# Patient Record
Sex: Female | Born: 1975 | Race: White | Hispanic: No | Marital: Married | Smoking: Never smoker
Health system: Southern US, Community
[De-identification: ages and names within clinical notes are randomized; demographics above are authoritative.]

## PROBLEM LIST (undated history)

## (undated) DIAGNOSIS — E785 Hyperlipidemia, unspecified: Secondary | ICD-10-CM

## (undated) DIAGNOSIS — Z0000 Encounter for general adult medical examination without abnormal findings: Secondary | ICD-10-CM

## (undated) DIAGNOSIS — Z9889 Other specified postprocedural states: Secondary | ICD-10-CM

## (undated) HISTORY — DX: Other specified postprocedural states: Z98.890

## (undated) HISTORY — DX: Encounter for general adult medical examination without abnormal findings: Z00.00

## (undated) HISTORY — DX: Hyperlipidemia, unspecified: E78.5

## (undated) HISTORY — PX: DILATION AND CURETTAGE OF UTERUS: SHX78

## (undated) HISTORY — PX: SINUS SURGERY WITH INSTATRAK: SHX5215

---

## 2001-01-28 ENCOUNTER — Other Ambulatory Visit: Admission: RE | Admit: 2001-01-28 | Discharge: 2001-01-28 | Payer: Self-pay | Admitting: Obstetrics and Gynecology

## 2001-07-29 ENCOUNTER — Encounter: Payer: Self-pay | Admitting: Otolaryngology

## 2001-07-29 ENCOUNTER — Ambulatory Visit (HOSPITAL_COMMUNITY): Admission: RE | Admit: 2001-07-29 | Discharge: 2001-07-29 | Payer: Self-pay | Admitting: Otolaryngology

## 2002-02-17 ENCOUNTER — Other Ambulatory Visit: Admission: RE | Admit: 2002-02-17 | Discharge: 2002-02-17 | Payer: Self-pay | Admitting: Obstetrics and Gynecology

## 2002-04-19 ENCOUNTER — Encounter: Admission: RE | Admit: 2002-04-19 | Discharge: 2002-05-26 | Payer: Self-pay | Admitting: Occupational Medicine

## 2002-04-27 ENCOUNTER — Encounter: Payer: Self-pay | Admitting: Occupational Medicine

## 2002-04-27 ENCOUNTER — Encounter: Admission: RE | Admit: 2002-04-27 | Discharge: 2002-04-27 | Payer: Self-pay | Admitting: Occupational Medicine

## 2002-11-22 ENCOUNTER — Encounter: Admission: RE | Admit: 2002-11-22 | Discharge: 2003-02-20 | Payer: Self-pay | Admitting: Nurse Practitioner

## 2003-02-21 ENCOUNTER — Encounter: Admission: RE | Admit: 2003-02-21 | Discharge: 2003-02-21 | Payer: Self-pay | Admitting: Nurse Practitioner

## 2003-03-17 ENCOUNTER — Other Ambulatory Visit: Admission: RE | Admit: 2003-03-17 | Discharge: 2003-03-17 | Payer: Self-pay | Admitting: Obstetrics and Gynecology

## 2003-07-24 ENCOUNTER — Encounter (INDEPENDENT_AMBULATORY_CARE_PROVIDER_SITE_OTHER): Payer: Self-pay | Admitting: Specialist

## 2003-07-24 ENCOUNTER — Ambulatory Visit (HOSPITAL_COMMUNITY): Admission: RE | Admit: 2003-07-24 | Discharge: 2003-07-24 | Payer: Self-pay | Admitting: Obstetrics and Gynecology

## 2003-08-09 ENCOUNTER — Inpatient Hospital Stay (HOSPITAL_COMMUNITY): Admission: EM | Admit: 2003-08-09 | Discharge: 2003-08-16 | Payer: Self-pay | Admitting: Emergency Medicine

## 2003-08-10 ENCOUNTER — Encounter (INDEPENDENT_AMBULATORY_CARE_PROVIDER_SITE_OTHER): Payer: Self-pay | Admitting: *Deleted

## 2003-11-28 ENCOUNTER — Ambulatory Visit (HOSPITAL_COMMUNITY): Admission: RE | Admit: 2003-11-28 | Discharge: 2003-11-28 | Payer: Self-pay | Admitting: Internal Medicine

## 2004-01-26 ENCOUNTER — Ambulatory Visit: Payer: Self-pay | Admitting: Oncology

## 2004-03-14 ENCOUNTER — Ambulatory Visit: Payer: Self-pay | Admitting: Oncology

## 2004-05-02 ENCOUNTER — Other Ambulatory Visit: Admission: RE | Admit: 2004-05-02 | Discharge: 2004-05-02 | Payer: Self-pay | Admitting: Obstetrics and Gynecology

## 2004-05-16 ENCOUNTER — Ambulatory Visit: Payer: Self-pay | Admitting: Oncology

## 2004-07-10 ENCOUNTER — Ambulatory Visit: Payer: Self-pay | Admitting: Oncology

## 2004-08-14 ENCOUNTER — Ambulatory Visit (HOSPITAL_COMMUNITY): Admission: RE | Admit: 2004-08-14 | Discharge: 2004-08-14 | Payer: Self-pay | Admitting: Obstetrics and Gynecology

## 2004-08-18 ENCOUNTER — Inpatient Hospital Stay (HOSPITAL_COMMUNITY): Admission: AD | Admit: 2004-08-18 | Discharge: 2004-08-18 | Payer: Self-pay | Admitting: Obstetrics and Gynecology

## 2004-09-05 ENCOUNTER — Ambulatory Visit: Payer: Self-pay | Admitting: Oncology

## 2004-09-24 ENCOUNTER — Inpatient Hospital Stay (HOSPITAL_COMMUNITY): Admission: AD | Admit: 2004-09-24 | Discharge: 2004-09-24 | Payer: Self-pay | Admitting: Obstetrics and Gynecology

## 2004-10-22 ENCOUNTER — Inpatient Hospital Stay (HOSPITAL_COMMUNITY): Admission: AD | Admit: 2004-10-22 | Discharge: 2004-10-22 | Payer: Self-pay | Admitting: Obstetrics and Gynecology

## 2004-10-25 ENCOUNTER — Ambulatory Visit: Payer: Self-pay | Admitting: Oncology

## 2004-10-29 ENCOUNTER — Inpatient Hospital Stay (HOSPITAL_COMMUNITY): Admission: AD | Admit: 2004-10-29 | Discharge: 2004-11-01 | Payer: Self-pay | Admitting: Obstetrics & Gynecology

## 2004-11-08 ENCOUNTER — Inpatient Hospital Stay (HOSPITAL_COMMUNITY): Admission: AD | Admit: 2004-11-08 | Discharge: 2004-11-12 | Payer: Self-pay | Admitting: Obstetrics and Gynecology

## 2004-11-11 ENCOUNTER — Ambulatory Visit: Payer: Self-pay | Admitting: Oncology

## 2004-11-11 ENCOUNTER — Encounter (INDEPENDENT_AMBULATORY_CARE_PROVIDER_SITE_OTHER): Payer: Self-pay | Admitting: *Deleted

## 2004-12-03 ENCOUNTER — Other Ambulatory Visit: Admission: RE | Admit: 2004-12-03 | Discharge: 2004-12-03 | Payer: Self-pay | Admitting: Obstetrics and Gynecology

## 2004-12-16 ENCOUNTER — Ambulatory Visit: Payer: Self-pay | Admitting: Oncology

## 2005-02-10 ENCOUNTER — Ambulatory Visit: Payer: Self-pay | Admitting: Oncology

## 2006-02-12 ENCOUNTER — Ambulatory Visit: Payer: Self-pay | Admitting: Oncology

## 2006-11-01 IMAGING — US US ABDOMEN COMPLETE
1 series · 13 of 25 positions shown · non-contrast
Comparison: none

CLINICAL DATA: 25 weeks pregnant.  Right upper quadrant abdominal pain.   
ABDOMEN ULTRASOUND:
TECHNIQUE: Complete abdominal ultrasound examination was performed including evaluation of the liver, gallbladder, bile ducts, pancreas, kidneys, spleen, IVC, and abdominal aorta.
There is no evidence of gallstones or gallbladder wall thickening.   There is no evidence of biliary ductal dilatation with common bile duct measuring approximately 3 mm.   The liver is normal in echogenicity and no focal liver lesions are seen.   Visualized portion of the IVC and pancreas are unremarkable.  
There is no evidence of splenomegaly.   Both kidneys are normal in size and no renal masses or other parenchymal lesions are identified.   There is no evidence of right-sided pelvicaliectasis.  Mild left renal pelvicaliectasis is noted, which is greater than typically seen at this stage of pregnancy.   The visualized portion of the abdominal aorta is nondilated and there is no evidence of abnormal fluid collections.

[Series 1: us abdomen complete · 0.35mm/px · 13 of 99 slices shown]
[im 1/99]
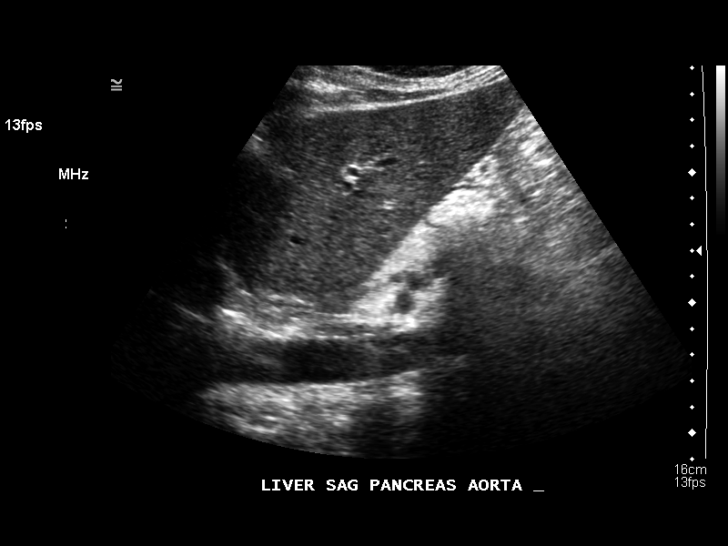
[im 9/99]
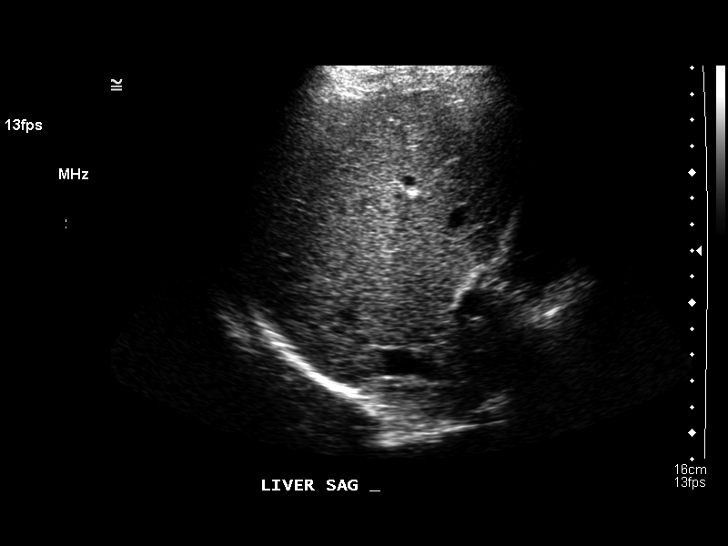
[im 17/99]
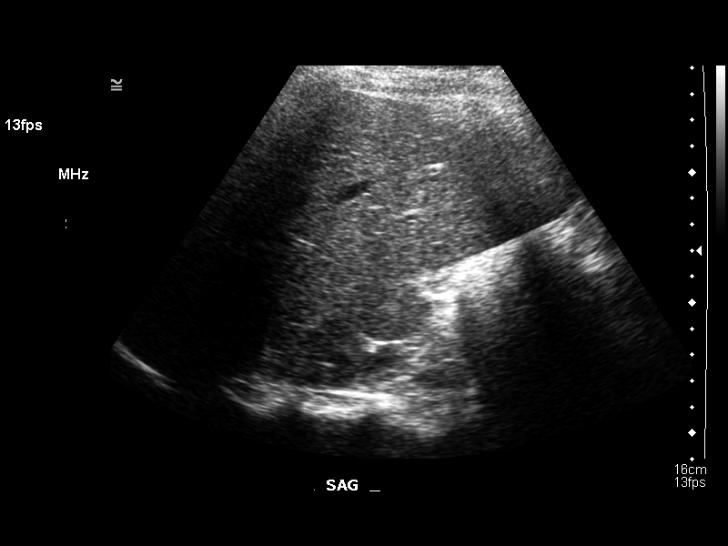
[im 25/99]
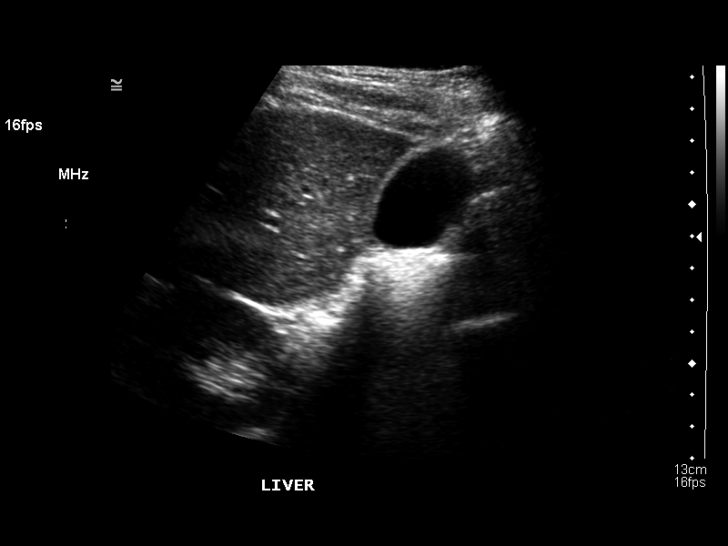
[im 33/99]
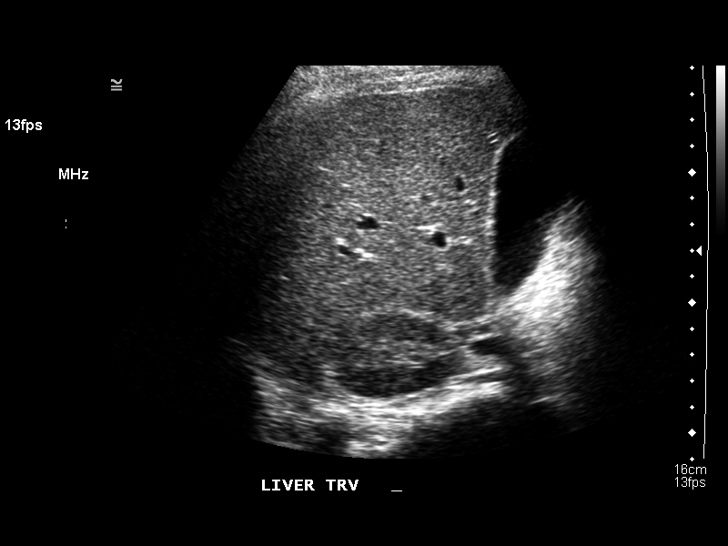
[im 41/99]
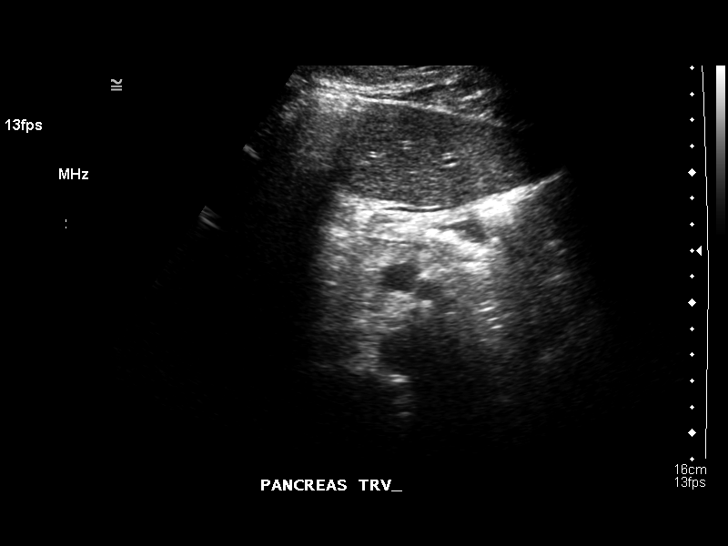
[im 50/99]
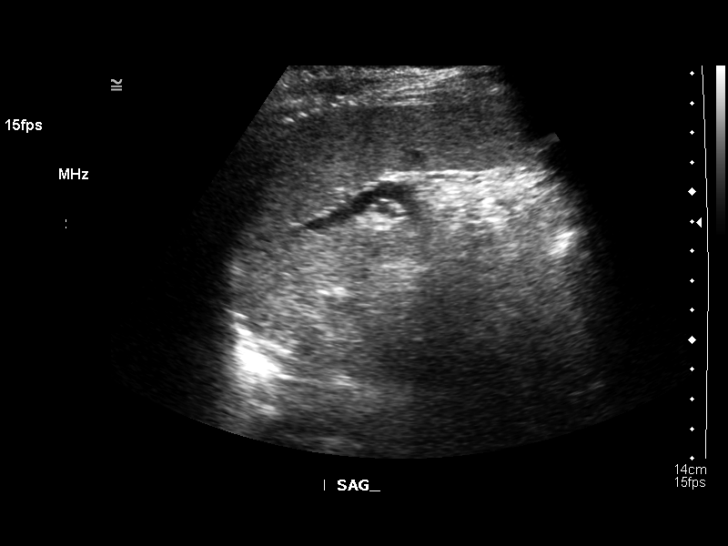
[im 58/99]
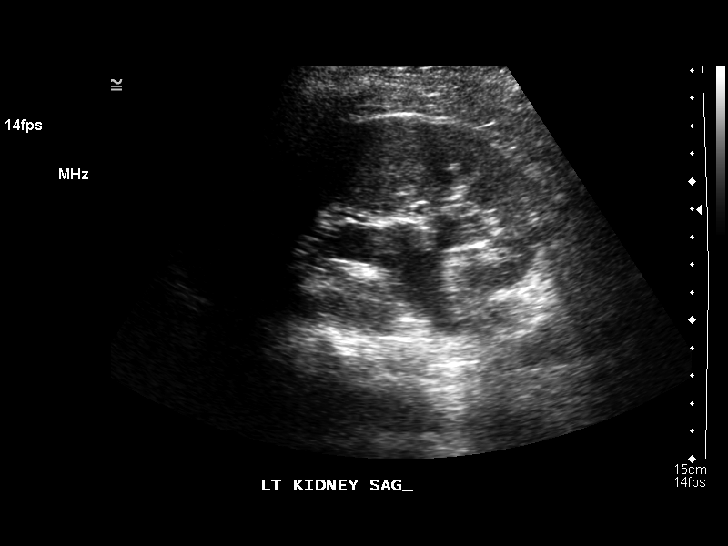
[im 66/99]
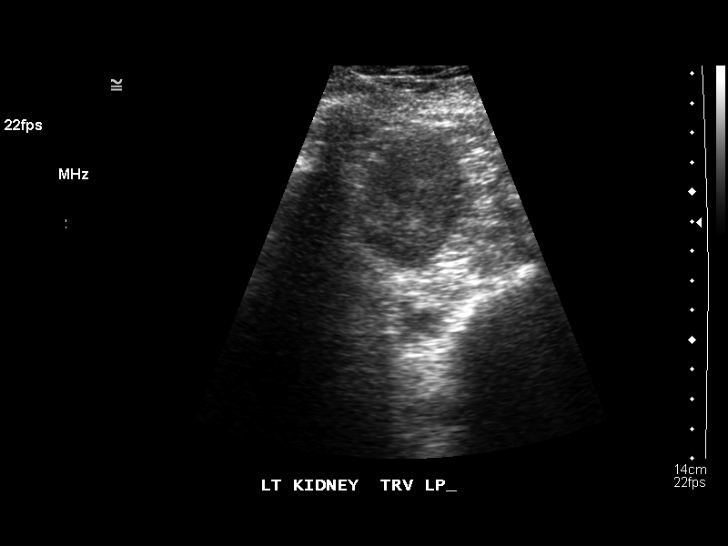
[im 74/99]
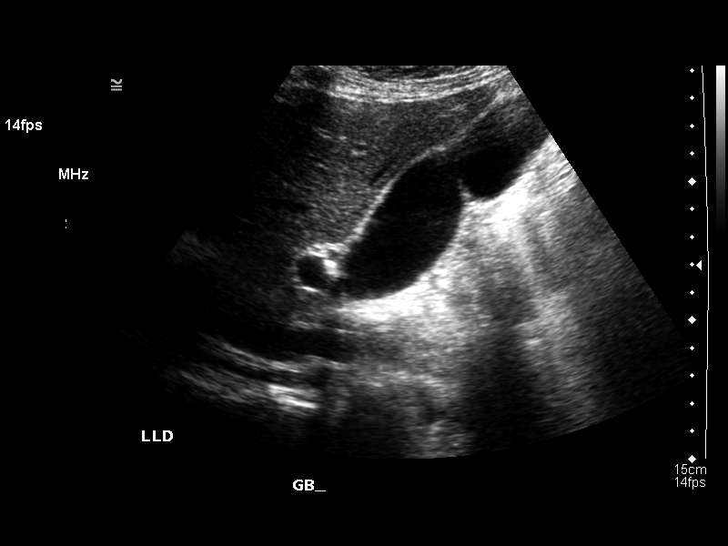
[im 82/99]
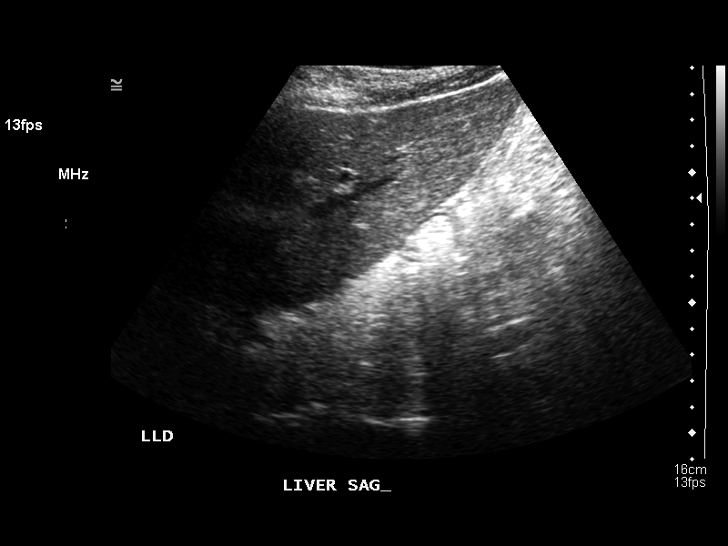
[im 90/99]
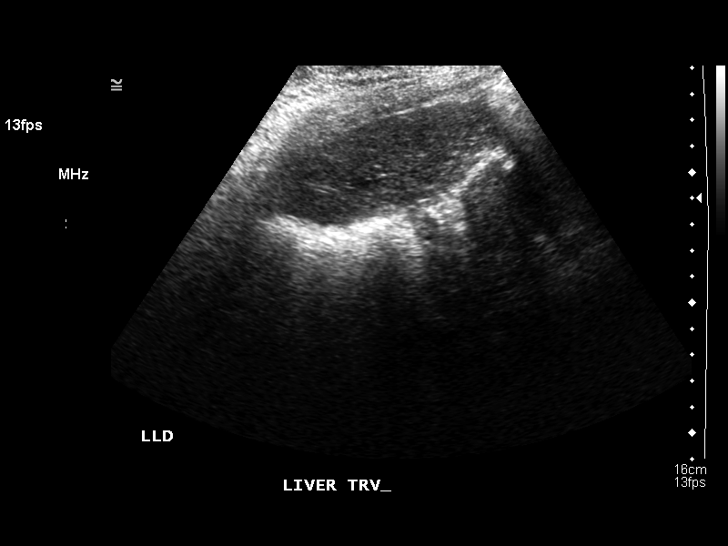
[im 99/99]
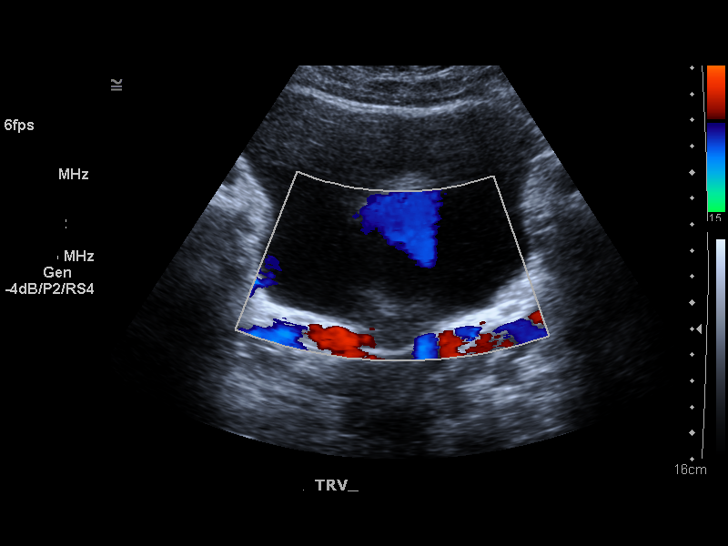

[13 of 25 positions shown; findings below may reference images not displayed]

IMPRESSION: 1.  No evidence of gallstones or biliary dilatation.
2.  Mild left renal pelvicaliectasis, greater then typically seen at this stage in pregnancy.  This is of uncertain etiology and significance.

## 2007-04-16 ENCOUNTER — Encounter (INDEPENDENT_AMBULATORY_CARE_PROVIDER_SITE_OTHER): Payer: Self-pay | Admitting: Obstetrics and Gynecology

## 2007-04-16 ENCOUNTER — Ambulatory Visit (HOSPITAL_COMMUNITY): Admission: RE | Admit: 2007-04-16 | Discharge: 2007-04-16 | Payer: Self-pay | Admitting: Obstetrics and Gynecology

## 2008-03-08 ENCOUNTER — Inpatient Hospital Stay (HOSPITAL_COMMUNITY): Admission: RE | Admit: 2008-03-08 | Discharge: 2008-03-10 | Payer: Self-pay | Admitting: Obstetrics and Gynecology

## 2008-03-09 ENCOUNTER — Ambulatory Visit: Payer: Self-pay | Admitting: Oncology

## 2008-09-03 ENCOUNTER — Emergency Department (HOSPITAL_COMMUNITY): Admission: EM | Admit: 2008-09-03 | Discharge: 2008-09-03 | Payer: Self-pay | Admitting: Family Medicine

## 2008-09-03 ENCOUNTER — Emergency Department (HOSPITAL_COMMUNITY): Admission: EM | Admit: 2008-09-03 | Discharge: 2008-09-04 | Payer: Self-pay | Admitting: Emergency Medicine

## 2008-09-04 ENCOUNTER — Encounter: Payer: Self-pay | Admitting: Internal Medicine

## 2008-09-06 ENCOUNTER — Encounter (INDEPENDENT_AMBULATORY_CARE_PROVIDER_SITE_OTHER): Payer: Self-pay | Admitting: Internal Medicine

## 2008-09-06 ENCOUNTER — Ambulatory Visit: Payer: Self-pay

## 2008-09-08 ENCOUNTER — Encounter: Payer: Self-pay | Admitting: Internal Medicine

## 2008-09-14 ENCOUNTER — Ambulatory Visit: Payer: Self-pay | Admitting: Internal Medicine

## 2008-09-14 DIAGNOSIS — R0602 Shortness of breath: Secondary | ICD-10-CM

## 2008-09-14 DIAGNOSIS — R079 Chest pain, unspecified: Secondary | ICD-10-CM

## 2008-09-14 DIAGNOSIS — I498 Other specified cardiac arrhythmias: Secondary | ICD-10-CM | POA: Insufficient documentation

## 2008-09-19 ENCOUNTER — Encounter: Payer: Self-pay | Admitting: Internal Medicine

## 2008-10-03 ENCOUNTER — Telehealth (INDEPENDENT_AMBULATORY_CARE_PROVIDER_SITE_OTHER): Payer: Self-pay | Admitting: *Deleted

## 2008-10-12 ENCOUNTER — Encounter: Payer: Self-pay | Admitting: Internal Medicine

## 2008-10-12 ENCOUNTER — Ambulatory Visit: Payer: Self-pay

## 2008-11-22 ENCOUNTER — Ambulatory Visit (HOSPITAL_COMMUNITY): Admission: RE | Admit: 2008-11-22 | Discharge: 2008-11-22 | Payer: Self-pay | Admitting: Internal Medicine

## 2009-04-06 DIAGNOSIS — E785 Hyperlipidemia, unspecified: Secondary | ICD-10-CM | POA: Insufficient documentation

## 2009-04-06 DIAGNOSIS — D239 Other benign neoplasm of skin, unspecified: Secondary | ICD-10-CM | POA: Insufficient documentation

## 2009-04-06 DIAGNOSIS — Z86711 Personal history of pulmonary embolism: Secondary | ICD-10-CM | POA: Insufficient documentation

## 2009-04-06 DIAGNOSIS — M545 Low back pain, unspecified: Secondary | ICD-10-CM | POA: Insufficient documentation

## 2009-07-02 DIAGNOSIS — E559 Vitamin D deficiency, unspecified: Secondary | ICD-10-CM | POA: Insufficient documentation

## 2009-07-02 DIAGNOSIS — E039 Hypothyroidism, unspecified: Secondary | ICD-10-CM | POA: Insufficient documentation

## 2010-04-20 LAB — D-DIMER, QUANTITATIVE: D-Dimer, Quant: 0.47 ug/mL-FEU (ref 0.00–0.48)

## 2010-04-20 LAB — POCT I-STAT, CHEM 8
BUN: 14 mg/dL (ref 6–23)
Calcium, Ion: 1.14 mmol/L (ref 1.12–1.32)
Chloride: 107 mEq/L (ref 96–112)
Creatinine, Ser: 0.8 mg/dL (ref 0.4–1.2)
Glucose, Bld: 92 mg/dL (ref 70–99)
HCT: 38 % (ref 36.0–46.0)
Hemoglobin: 12.9 g/dL (ref 12.0–15.0)
Potassium: 3.7 mEq/L (ref 3.5–5.1)
Sodium: 140 mEq/L (ref 135–145)
TCO2: 24 mmol/L (ref 0–100)

## 2010-04-30 LAB — CBC
HCT: 29.2 % — ABNORMAL LOW (ref 36.0–46.0)
HCT: 30 % — ABNORMAL LOW (ref 36.0–46.0)
Hemoglobin: 10.1 g/dL — ABNORMAL LOW (ref 12.0–15.0)
Hemoglobin: 9.7 g/dL — ABNORMAL LOW (ref 12.0–15.0)
MCHC: 33.2 g/dL (ref 30.0–36.0)
MCHC: 33.8 g/dL (ref 30.0–36.0)
MCV: 90.8 fL (ref 78.0–100.0)
MCV: 90.9 fL (ref 78.0–100.0)
Platelets: 251 10*3/uL (ref 150–400)
Platelets: 272 10*3/uL (ref 150–400)
RBC: 3.22 MIL/uL — ABNORMAL LOW (ref 3.87–5.11)
RBC: 3.3 MIL/uL — ABNORMAL LOW (ref 3.87–5.11)
RDW: 13.8 % (ref 11.5–15.5)
RDW: 13.8 % (ref 11.5–15.5)
WBC: 13.2 10*3/uL — ABNORMAL HIGH (ref 4.0–10.5)
WBC: 8.7 10*3/uL (ref 4.0–10.5)

## 2010-04-30 LAB — RPR: RPR Ser Ql: NONREACTIVE

## 2010-05-20 ENCOUNTER — Other Ambulatory Visit: Payer: Self-pay | Admitting: Dermatology

## 2010-05-28 NOTE — Op Note (Signed)
[name], [name]                 ACCOUNT NO.:  [id number]   MEDICAL RECORD NO.:  [id number]          PATIENT TYPE:  AMB   LOCATION:  DAY                          FACILITY:  [facility]   PHYSICIAN:  Michelle L. Grewal, M.D.DATE OF BIRTH:  Jun 01, 1975   DATE OF PROCEDURE:  04/16/2007  DATE OF DISCHARGE:                               OPERATIVE REPORT   PREOPERATIVE DIAGNOSES:  1. Missed abortion.  2. History of pulmonary vein thrombosis.   POSTOPERATIVE DIAGNOSES:  1. Missed abortion.  2. History of pulmonary vein thrombosis.   PROCEDURE:  Dilatation and evacuation with chromosome analysis.   SURGEON:  Dr. Vincente Poli.   ANESTHESIA:  MAC with local.   FINDINGS:  Products of conception.   PROCEDURE:  The patient was taken to the operating room.  She was  prepped and draped in the usual fashion, in-and-out catheter was used to  empty the bladder.  Antibiotics of course were given and the patient had  pulsatile stockings on prior to induction of anesthesia. After the  patient prepped and draped, a speculum was placed in the vagina and the  cervix was grasped with a tenaculum.  A paracervical block was performed  in standard fashion.  It was noted that the patient had a moderate of  blood in the vagina which she had reported she had started with  overnight.  The cervix was slightly dilated.  I did dilate a little bit  more with Pratt dilators and inserted a #7 suction cannula inside the  uterus and suctioned the tissue out consistent with products of  conception.  I did this twice. A sharp curette was inserted, minimal  tissue was noted at the time, the uterine cavity was very clean.  A  final suction curettage was performed which retrieved no tissue.  All  instruments were removed from the vagina. There was no vaginal bleeding  noted.  All sponge, lap and instrument counts were correct x2.  The  patient went to recovery room in stable condition.      Michelle L. Vincente Poli, M.D.  Electronically Signed     MLG/MEDQ  D:  04/16/2007  T:  04/16/2007  Job:  [id number]

## 2010-05-31 NOTE — Discharge Summary (Signed)
[name], [name]                             ACCOUNT NO.:  [id number]   MEDICAL RECORD NO.:  [id number]                   PATIENT TYPE:  INP   LOCATION:  2022                                 FACILITY:  [facility]   PHYSICIAN:  Melissa L. Ladona Ridgel, MD               DATE OF BIRTH:  [birth year]   DATE OF ADMISSION:  08/09/2003  DATE OF DISCHARGE:  08/16/2003                                 DISCHARGE SUMMARY   ADMISSION DIAGNOSES:  1. Chest pain.  2. Shortness of breath.  3. Sinus polyps.   DISCHARGE DIAGNOSES:  1. Pulmonary embolus with pulmonary vein thrombosis.  2. Stable seasonal allergies.  3. Right arm hematoma secondary to blood draws while patient on Coumadin.   HISTORY OF PRESENT ILLNESS:  The patient is a 35 year old female who on the  day of admission experienced a sharp chest pain with some associated  shortness of breath and diaphoresis.  She presented to the emergency room  for evaluation and was found to have two left upper lobe pulmonary emboli as  well as bilateral pulmonary venous thrombosis on chest CT.  The patient was  started on heparin after a repeat platelet count as her initial admitting  platelet count was 70,000.  Repeat platelet count in a blue top tube  revealed a true platelet count of 254,000.  Her D-dimer was interestingly  enough negative on admission and she was admitted for further workup of a  hypercoagulable state.  The patient's hypercoagulability panel revealed  negativity for factor V mutation.  Her antithrombin III was within normal  limits range at 102.  Her total protein C was in normal range at 111.  Her  functional protein C was in reference range at 131.  Her total protein S was  in range at 124.  Her functional protein S was 99 which is well within  range.  Her PTTLA was 37.8 which is also within normal limits.  Her DRVTT  was 39.9 within normal limits.  A lupus anticoagulant was not detected.  Homocystine level was within normal range at  8.31.  Her beta-II glycoprotein  1AB/IgG was 5 and her beta-II glycoprotein 1AB/IgM was less than 1.  Her  anticardiolipin antibody IgG was 9.  Her anticardiolipin antibody IgM was 6.  Her anticardiolipin IgA was 1 all within reasonable range.  She is negative  for prothrombin II gene mutation.  During the course of her hospitalization,  the patient was started on a heparin drip and Coumadin was introduced on day  #2.  At the time of discharge, her pertinent laboratory studies reveal a PT  of 22.8 and an INR of 2.7 on 7.5 mg of Coumadin.  Her CBC on the day of  discharge reveals a white count of 7.7, hemoglobin 13.1, hematocrit 37.9 and  platelets 215.  During the course of the hospitalization, the patient  experienced the formation of  a right forearm hematoma related to a blood  draw.  The area today is ecchymotic and tender.  There is a central area of  tense swelling which actually appears slightly improved from yesterday.  She  had improved mobility today, but still has a way to go.  She has been  instructed to elevate that arm and continue to facilitate as much medicine  as she possibly can as she has been provided with a sling and understands  that this discoloration will likely increase while it is getting better.   DISCHARGE PHYSICAL EXAMINATION:  VITAL SIGNS:  Temperature 97.1, blood  pressure systolic 110/60, pulse 68, respirations 20, 98% on room air.  She  is able to ambulate without discomfort and saturates appropriately.  GENERAL:  She is a pleasant, white female in minimal distress secondary to  the pain in her right arm.  HEENT:  Normocephalic, atraumatic.  Pupils equal round and reactive to  light.  Extraocular movements intact.  Mucous membranes moist.  CHEST:  Clear to auscultation.  No rhonchi, rales or wheezes.  CARDIAC:  Regular rate and rhythm with positive S1, S2, no S3, S4.  No  murmurs, rubs or gallops.  ABDOMEN:  Soft, nontender, nondistended with positive bowel  sounds.  EXTREMITIES:  Right forearm over the antecubital area and belly of the bicep  appears ecchymotic and tender.  There is firm swelling which is slightly  improved from yesterday.  The ecchymosis extends up into the triceps  compartment and axilla.  Otherwise, she has no clubbing, cyanosis or edema.   SPECIAL INSTRUCTIONS:  The patient has been instructed extensively on the  need to plan her next pregnancy since she will be on Coumadin and this can  be harmful to her fetus.  She has been in close contact with her OB/GYN  physician and they have arranged alternative form of contraception.  She has  viewed the Coumadin video and literature and has been directed to that  information for her dietary restriction.   FOLLOW UP:  She has been instructed to follow up with Dr. Marcelino Duster L.  Grewal, her OB/GYN, as arranged previously and to make an appointment to see  Dr. Maurice Small on Thursday or Friday of this week to have her INR  checked and have her FMLA papers filled out.   DISPOSITION:  The patient is stable for discharge to home with followup with  her primary care physician and her OB/GYN.   DISCHARGE MEDICATIONS:  1. Singulair 10 mg q.d.  2. Nasonex used in the hospital, but she is to resume her Rhinocort two     sprays q.d.  3. Coumadin 5 mg once daily.  4. Prenatal vitamin once daily.  5. Colace 100 mg twice daily as needed.  6. Claritin D 10 mg once daily.                                                Melissa L. Ladona Ridgel, MD    MLT/MEDQ  D:  08/16/2003  T:  08/16/2003  Job:  [id number]   cc:   Gretta Arab. Valentina Lucks, M.D.  [address]  [place]  [place] [address]  Fax: [phone]   Stann Mainland. Vincente Poli, M.D.  [address], [address] [place]  [place] [address]  Fax: [phone]

## 2010-05-31 NOTE — Discharge Summary (Signed)
[name], [name]                             ACCOUNT NO.:  [id number]   MEDICAL RECORD NO.:  [id number]                   PATIENT TYPE:  INP   LOCATION:  2022                                 FACILITY:  [facility]   PHYSICIAN:  Melissa L. Ladona Ridgel, MD               DATE OF BIRTH:  [birth year]   DATE OF ADMISSION:  08/09/2003  DATE OF DISCHARGE:  08/16/2003                                 DISCHARGE SUMMARY   ADDENDUM:  Please note Vicodin 5/500 #15 total were provided for pain relief  of the right arm.  Also, the recommendation for followup of her potential  hypercoagulability would be that after approximately six months of  anticoagulation that she undergo repeat testing for hypercoagulability.  She  was instructed on things to do in situations, for instance; long car rides,  airplane rides and was instructed to check in with her primary care  physician before performing these activities.  A hematology oncology consult  may be warranted at the time of this discontinuation of her anticoagulation  and this can be provided through her primary care physician.                                                Melissa L. Ladona Ridgel, MD    MLT/MEDQ  D:  08/16/2003  T:  08/16/2003  Job:  [id number]   cc:   Marcelino Duster L. Vincente Poli, M.D.  [address], [address]  [place]  [place] [address]  Fax: [phone]   Gretta Arab. Valentina Lucks, M.D.  [address] [place]  [place] [address]  Fax: [phone]

## 2010-05-31 NOTE — H&P (Signed)
NAMEHERBERT, [name]                             ACCOUNT NO.:  [id number]   MEDICAL RECORD NO.:  [id number]                   PATIENT TYPE:  INP   LOCATION:  1824                                 FACILITY:  [facility]   PHYSICIAN:  Melissa L. Ladona Ridgel, MD               DATE OF BIRTH:  Oct 05, 1975   DATE OF ADMISSION:  08/09/2003  DATE OF DISCHARGE:                                HISTORY & PHYSICAL   CHIEF COMPLAINT:  Chest pain.   PRIMARY CARE PHYSICIAN:  Gretta Arab. Valentina Lucks, M.D.   HISTORY OF PRESENT ILLNESS:  The patient is a 35 year old white female who  is status post a dilation and evacuation of a nonviable pregnancy on July  11. The patient found at her 10-week visit that the fetus was nonviable  without a heart beat. She underwent the procedure and was home the next day  without complications. She has had no bleeding since last Saturday. Today  while getting ready to go to work, she developed some chest discomfort and  thought nothing of it thinking perhaps it was gas. 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 She also reports on the night prior to this admission she had  an episode of nausea with no vomiting but an episode of diarrhea lasting  approximately an hour. This episode was unclear in its etiology and has not  continued today.  When the symptom that occurred this morning worsened, she  came to the emergency room and was found to have pulmonary emboli in the  left upper lobe as well as pulmonary venous thrombosis on chest CT.  Initially the patient's platelets were found to be 70 in the emergency room.  The platelet count was repeated in a blue top tube and discovered that the  platelet count is really 254. We therefore starter her on heparin. Her D-  dimer was negative, interestingly enough.  She gives no family history of  any  hypercoagulable state.   REVIEW OF SYMPTOMS:  As above. She denies any dysuria, hematuria, melena,  hematochezia and all other review of systems are negative.Marland Kitchen   PAST MEDICAL HISTORY:  Sinus polyp, back pain related to occupational injury  and increased cholesterol for which she is on dietary control.   PAST SURGICAL HISTORY:  Polyps in her sinuses x3.   ALLERGIES:  PENICILLIN.   SOCIAL HISTORY:  She does not smoke, she does drink, she does not illicit  drugs or any over the counter herbal remedies.   MEDICATIONS:  1. Prenatal vitamin q.day.  2. Claritin D p.r.n.  3. Singulair 10 mg q.day.  4. Rhinocort 2 sprays once daily.  5. Skelaxin p.r.n. for her back.  6. Motrin p.r.n.   FAMILY HISTORY:  Significant for her dad living with  multiple sclerosis, her  mom living with increased cholesterol and osteopenia. A grandmother had  breast cancer and her sister is alive and healthy.   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 The patient states that she  stopped oral contraceptives back in October after being on them for 10  years.   LABORATORY DATA:  Her laboratory values show two small left upper lobe  pulmonary emboli, ill defined left lower lobe, pulmonary emboli, bilateral  superior pulmonary vein thromboses, x-ray shows bronchitic changes.  Her  sodium is 137, potassium 4.7, chloride 107. CO2 is 28.7, BUN is 9,  creatinine is 0.7.  White count is 8.6, hemoglobin 13.7, hematocrit 39.7  with a platelet count of 254.  This is off of a EDTA tube.  Her LFT's are  within normal limits. Her total bilirubin is mildly elevated at 1.3 with an  indirect bilirubin of 1.1.  Point of care enzymes are negative x2 sets, her  D-dimer is 0.27 which is negative.  Her pH is 7.4, pCO2 of 40.3 and her  bicarb is 27.3 on venous gas.   EKG shows normal sinus rhythm with sinus arrhythmia.   PHYSICAL EXAMINATION:  VITAL SIGNS:  Temperature is 98, blood pressure  111/68, pulse 65, respiratory  rate 22.  Pulse ox is 99% on 2 liters.  GENERAL:  Well-developed, well-nourished, white female in no acute distress.  HEENT:  Pupils equal round and reactive to light. Extraocular movements  intact. She is anicteric.  Mucous membranes are moist.  NECK:  Supple. There is no JVD, no lymph nodes.  CHEST:  Clear to auscultation.  No rhonchi, rales or wheezes.  CARDIOVASCULAR:  Regular rate and rhythm, positive S1, S2, no S3, S4. No  murmurs, rubs or gallops.  ABDOMEN:  Soft, nontender, nondistended with positive bowel sounds.  EXTREMITIES:  2+ pulses. No cyanosis, clubbing or edema.  NEUROLOGIC:  She is nonfocal.  BREASTS:  Exam in the supine position only was performed with no obvious  lymphadenopathy and no breast masses noted.   ASSESSMENT/PLAN:  This is a 35 year old white female with presentation with  chest pain discovered to have pulmonary emboli in multiple origins as well  as pulmonary vein thrombosis status post dilation and evacuation of a  nonviable pregnancy. She will be to telemetry for anticoagulation and  further observation.   PLAN:  1. Because she has had unprotected sexual intercourse post procedure and had     an episode of nausea with diarrhea with last p.m., we will check her     pregnancy test to assure that she is not pregnant.  2. Pulmonary emboli.  Will start her on heparin, Coumadin will be started in     the next 48 hours.  We will send off a hypercoagulability panel and     Doppler her lower extremities to rule out possible DVT.  If not genetic     disorder is present and no DVT are located in the lower extremities, we     might want to consider looking in the pelvic veins     for thrombosis.  Pulmonary wise she is on supportive O2.  Will order     incentive spirometry.  3. Cardiovascular.  She is sinus rhythm with no ST-T wave changes.  I will     check a 2-D echo to assess wall motions and for an ejection fraction.  Melissa L. Ladona Ridgel, MD    MLT/MEDQ  D:  08/09/2003  T:  08/09/2003  Job:  [id number]   cc:   Marcelino Duster L. Vincente Poli, M.D.  [address]  [place]  [place] [address]  Fax: [phone]   Gretta Arab. Valentina Lucks, M.D.  [address] [place]  [place] [address]  Fax: [phone]

## 2010-05-31 NOTE — Op Note (Signed)
NAMECARMEL, Danielle                 ACCOUNT NO.:  [id number]   MEDICAL RECORD NO.:  [id number]          PATIENT TYPE:  INP   LOCATION:  9317                          FACILITY:  [facility]   PHYSICIAN:  [name], M.D.DATE OF BIRTH:  January 15, 1975   DATE OF PROCEDURE:  11/11/2004  DATE OF DISCHARGE:                                 OPERATIVE REPORT   PREOPERATIVE DIAGNOSES:  1.  Postpartum hemorrhage.  2.  Retained tissue.   POSTOPERATIVE DIAGNOSES:  1.  Postpartum hemorrhage.  2.  Retained tissue.   PROCEDURE:  Dilatation and curettage.   SURGEON:  Danielle L. Vincente Poli, M.D.   ESTIMATED BLOOD LOSS:  100 mL.   ANESTHESIA:  MAC with local.   PATHOLOGY:  Remaining products of conception.   DRAINS:  None.   PROCEDURE:  The patient was taken to the operating room, where she was given  sedation and placed in lithotomy position.  She was prepped and draped in  the usual sterile fashion.  An in-and-out catheter used to empty the  bladder.  The speculum was inserted into the vagina.  The cervix was grasped  with a tenaculum.  Paracervical block was performed in the standard fashion.  The cervical internal os was noted to be slightly dilated.  A sharp curette  was inserted.  The uterus was thoroughly curetted of some what looked like  old fibrinous tissue, possibly a small, very scant amount of what looked to  be like chorioamnion.  There was no identifiable placental tissue noted.  The uterus was thoroughly curetted.  It seemed like her uterus had firmed down nicely.  I did give her Methergine  and Hemabate to help with uterine contraction.  I observed her after the  curettage was performed for bleeding for approximately 10 minutes in the  operating room, and no bleeding was noted.  The patient was then taken to  the recovery room in stable condition.  She will be observed overnight with  the Methergine series and Clindamycin for antibiotic prophylaxis.      Danielle L. Vincente Poli,  M.D.  Electronically Signed    MLG/MEDQ  D:  11/11/2004  T:  11/12/2004  Job:  [id number]

## 2010-05-31 NOTE — Discharge Summary (Signed)
[name], [name]                 ACCOUNT NO.:  [id number]   MEDICAL RECORD NO.:  [id number]          PATIENT TYPE:  INP   LOCATION:  9317                          FACILITY:  [facility]   PHYSICIAN:  Dineen Kid. Rana Snare, M.D.    DATE OF BIRTH:  October 22, 1975   DATE OF ADMISSION:  11/08/2004  DATE OF DISCHARGE:  11/12/2004                                 DISCHARGE SUMMARY   ADMISSION DIAGNOSES:  1.  Postpartum hemorrhage, status post spontaneous vaginal delivery      approximately eight days previously.  2.  History of pulmonary embolus, currently anticoagulated   DISCHARGE DIAGNOSES:  1.  Status post postpartum hemorrhage, stable.  2.  History of pulmonary and embolus, currently on prophylactic      anticoagulation.   PROCEDURE:  D&E.   REASON FOR ADMISSION:  Please see written H&P.   HOSPITAL COURSE:  The patient is a 35 year old gravida 2, para 1, that was  readmitted to [facility] eight days postpartum with  complaints of heavy vaginal bleeding and heavy clot formation.  The patient  was experiencing some mild cramping.  The patient did have a history of  previous pulmonary embolus currently on anticoagulation, Lovenox subcu and  Coumadin 5 mg alternating with 7.5 mg daily, under the care of hematologist.  On admission the patient was afebrile.  Heart rate was in the 70s.  Blood  pressure 105-110/50s-60s.  Speculum exam did reveal 150 mL of bright red  blood noted in the vagina.  The uterus after massage was firm and globular.  Ultrasound was ordered and CBC and type and cross and screen for two units  of blood was ordered.  Repeat speculum exam in approximately an hour did  reveal a small amount of dark red blood noted in vault.  Episiotomy was  without active bleeding.  The patient was started on antibiotics, serial  hemoglobin and hematocrit.  Lovenox dose was decreased to 40 mg, and the  patient's Coumadin was discontinued.  IV fluids were started with Pitocin,  and  Methergine was ordered intravascular for control of uterine atony.  Dr.  Myna Hidalgo, hematologist, was consulted regarding postpartum hemorrhage on  anticoagulant therapy.  Later that evening the patient had had a vaginal  pack which had fallen down during bowel movement.  Some increase in vaginal  bleeding was noted with a large-size clot that was passed.  A decision was  made to administer fresh frozen plasma and continue with the Methergine, IV  fluids, continue with 40 units of Pitocin.  Blood pressure was now 87/58.  The patient was then transferred to the AICU, where she could be monitored  closely.  Blood pressure 90/40s-60s, heart rate 60s to 70s.  Fundus was now  firm.  There was a scant amount of blood noted on the pad.  Hemoglobin was  8.1.  The following morning the patient did complain of some mild uterine  cramping.  She denied nausea, vomiting or increase in vaginal bleeding.  Vital signs were stable with blood pressure 87/40s-50s and heart rate was in  the 50s.  She was afebrile.  Fundus was firm with a scant amount of blood  noted on pad and no increase in vaginal bleeding or clots even with fundal  massage.  PT was 21.  PTT was 48.  INR was 1.8.  Fibrinogen was 352.  Hemoglobin was 7.4, platelet count of 400.  Two additional units of fresh  frozen plasma were given.  Later that morning the patient was without  complaint, o further bleeding, blood pressure 90/40s-50s, heart rate in the  60s.  Hemoglobin was now 6.9.  On the following morning the patient  continued to be stable, blood pressure 90s-100/50s-60s, pulse oximetry was  98% on room air.  Abdomen soft, fundus firm, slightly tender.  A scant  amount of blood was noted on the pad.  Hemoglobin was 8.4, platelet count of  351,000.  INR was 1.7, fibrinogen continued be stable at 435.  The patient  continued on DVT prophylaxis of Lovenox 40 mg daily.  The patient continued  to be monitored closely in the AICU with some slight  increase in activity  scheduled for later that morning.  Later that evening the patient did  increase some vaginal bleeding as associated with increase in activity,  soaking two pads with two baseball-size clots.  She denied feeling  lightheaded or dizzy or any complaints of shortness of breath.  Blood  pressure was 100-110/70s, heart rate at 70s to 80s.  Fundus was firm.  Hemoglobin was repeated, which was 8.4.,with INR of 1.4.  Methergine was  administered orally x3 doses.  The patient was again given two units of  fresh frozen plasma.  On the following morning the patient did have some  scant bleeding with some small clots.  Vital signs were stable.  She was  afebrile.  PT was 46.  INR was 1.1.  Hemoglobin 8.7, platelet count of 393.  A third dose of Methergine was due at noon on that day.  The patient was  typed and crossed for two units of packed red blood cells, Ultrasound was  scheduled.  Later that afternoon ultrasound had revealed suggestion of  retained placental products of conception.  Vital signs were stable.  She  was afebrile.  Risks were discussed with the patient and spouse regarding  possible surgical risks, all questions were answered.  Coagulation studies  were ordered preoperatively.  The patient was scheduled for D&E.  Later that  evening. the patient did sustain a D&E, which had revealed some retained  placental products.  The hematologist was in for a consultation, and  bleeding did subside.  On the following morning the patient was without  complaint, minimal bleeding was noted.  Vital signs were stable.  INR was  1.1.  PT was 14.1.  Platelet count 468,000.  Abdomen was soft, fundus firm.  IV antibiotics had been discontinued.  Discharge instructions reviewed and  the patient was later discharged home.   CONDITION ON DISCHARGE:  Stable.   DIET:  Regular as tolerated.   ACTIVITY:  Bedrest with bathroom privileges.  FOLLOW-UP:  Patient to follow up with Dr.  Cyndie Chime in approximately two  days.  The patient was also scheduled to return to the office in one to two  weeks for OB check.  She is to call for increase in vaginal bleeding,  uterine cramping, of feeling lightheaded or dizzy.   DISCHARGE MEDICATIONS:  1.  Lovenox 40 mg subcu q.a.m.  2.  Methergine 0.2 mg p.o. every eight hours x3 additional  doses.  3.  Keflex 500 mg one p.o. t.i.d. times 7 days.  4.  Prenatal vitamins one p.o. daily.      Julio Sicks, N.P.      Dineen Kid Rana Snare, M.D.  Electronically Signed    CC/MEDQ  D:  01/29/2005  T:  01/29/2005  Job:  [id number]

## 2010-05-31 NOTE — Op Note (Signed)
[name], [name]                             ACCOUNT NO.:  [id number]   MEDICAL RECORD NO.:  [id number]                   PATIENT TYPE:  AMB   LOCATION:  SDC                                  FACILITY:  [facility]   PHYSICIAN:  Michelle L. Vincente Poli, M.D.            DATE OF BIRTH:  [birth year]   DATE OF PROCEDURE:  07/24/2003  DATE OF DISCHARGE:  07/24/2003                                 OPERATIVE REPORT   PREOPERATIVE DIAGNOSES:  Missed abortion.   POSTOPERATIVE DIAGNOSES:  Missed abortion.   PROCEDURE:  Dilatation and evacuation.   SURGEON:  Michelle L. Vincente Poli, M.D.   ANESTHESIA:  MAC with paracervical.   ESTIMATED BLOOD LOSS:  Minimal.   DESCRIPTION OF PROCEDURE:  After informed consent was obtained, the patient  was taken to the operating room. She was given sedation and placed in the  high lithotomy position. She was prepped and draped in the standard fashion  and an in and out catheter was used to empty the bladder. A speculum was  inserted into the vagina, the cervix was grasped with a tenaculum and the  uterus was sounded to 7 cm and noted to be in the midline. The cervical  interval os was gently dilated using Pratt dilators. A #7 suction cannula  was inserted into the uterus and the uterus was thoroughly suctioned of all  contents grossly consistent with products of conception. The suction cannula  was removed and a sharp curette was inserted and a sharp curettage was then  thoroughly performed.  A final suction curettage was then performed. At the  end of the procedure, there was no vaginal bleeding noted, all instruments  were removed from the vagina, all sponge, lap and instrument counts were  correct x2. The patient tolerated the procedure well and went to the  recovery room in stable condition.                                               Michelle L. Vincente Poli, M.D.    Florestine Avers  D:  07/26/2003  T:  07/26/2003  Job:  [id number]

## 2010-07-26 ENCOUNTER — Encounter: Payer: Self-pay | Admitting: Internal Medicine

## 2010-10-08 LAB — CBC
MCHC: 35.3
MCV: 89.2
Platelets: 246
RBC: 4.15

## 2010-10-08 LAB — PROTIME-INR: Prothrombin Time: 13.1

## 2010-11-22 IMAGING — CT CT ANGIO CHEST
2 of 7 series · 19 of 36 positions shown · IV contrast (APPLIED)
Comparison: The prior examination 11/28/2003.

CLINICAL DATA: Chest pain shortness of breath.  Question pulmonary
embolism.

CT ANGIOGRAPHY CHEST WITH CONTRAST
TECHNIQUE: Multidetector CT imaging of the chest was performed
using the standard protocol during bolus administration of
intravenous contrast. Multiplanar CT image reconstructions
including MIPs were obtained to evaluate the vascular anatomy.
Contrast: 80 ml Emnipaque-LAA intravenously.

[Series 8: pulm embolism 1.0 b25f thins · axial · 0.67mm/px · z∈[-6,+249]mm · 18 of 285 slices shown]
[im 15/285  lung]
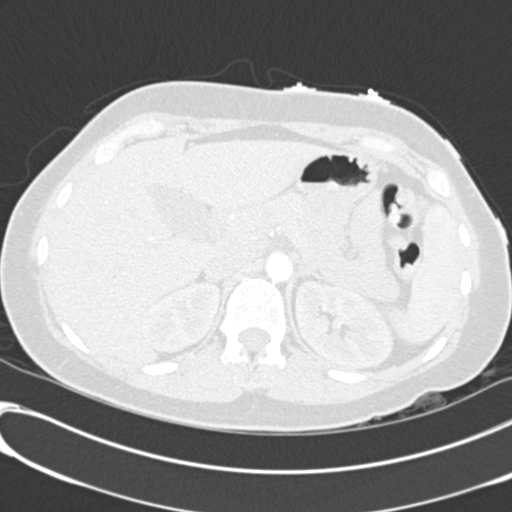
[im 29/285  mediastinal]
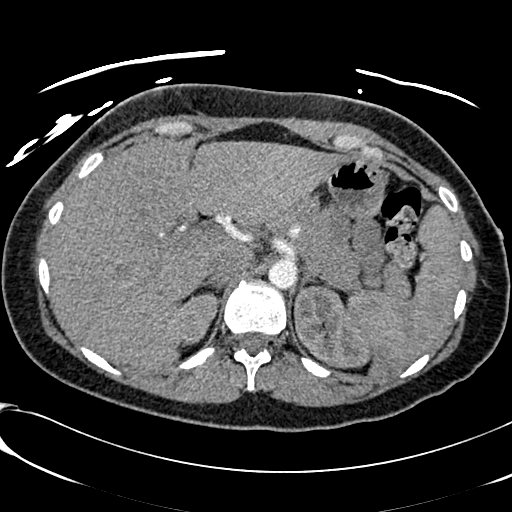
[im 43/285  lung]
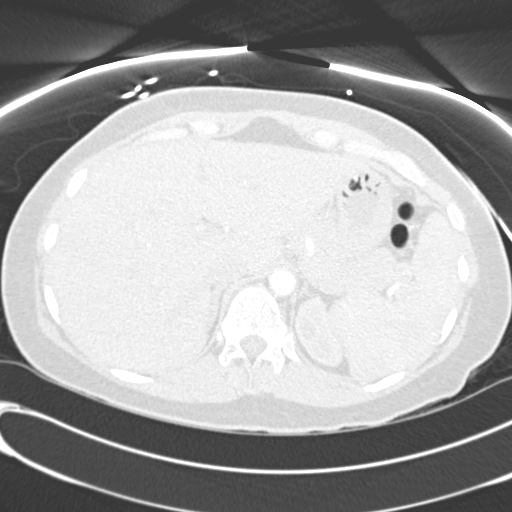
[im 57/285  mediastinal]
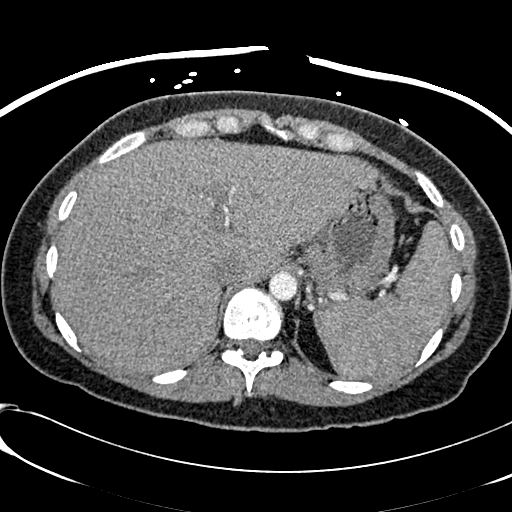
[im 72/285  lung]
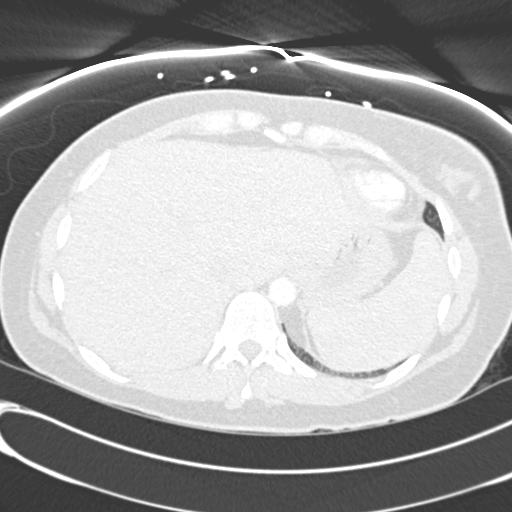
[im 86/285  mediastinal]
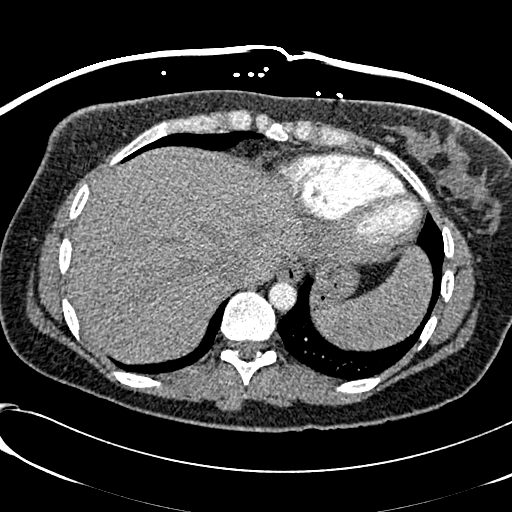
[im 100/285  lung]
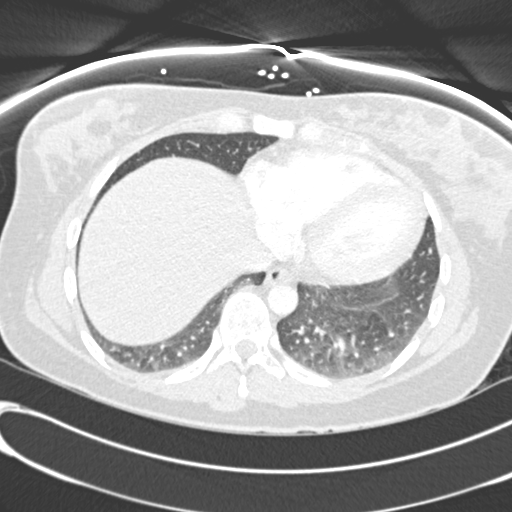
[im 114/285  mediastinal]
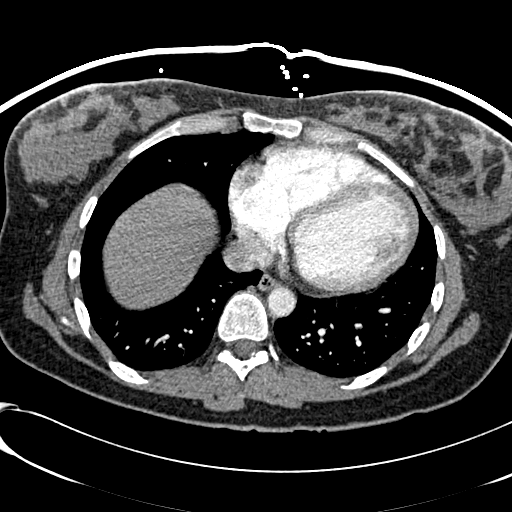
[im 128/285  lung]
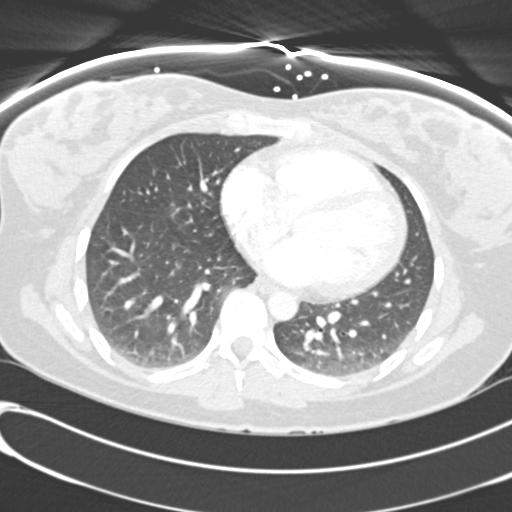
[im 157/285  mediastinal]
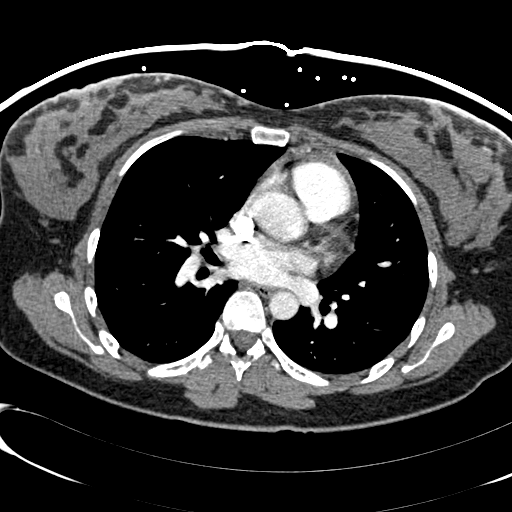
[im 171/285  lung]
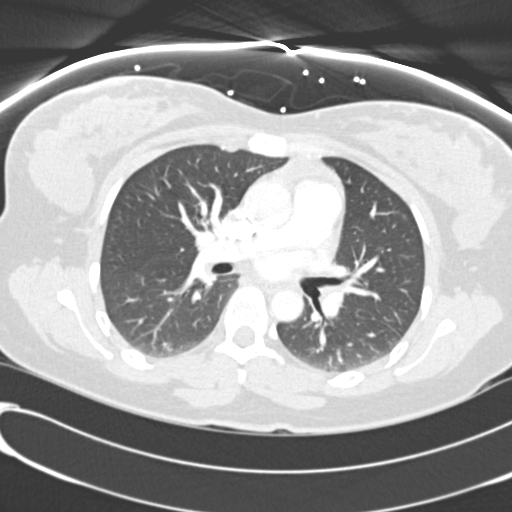
[im 185/285  mediastinal]
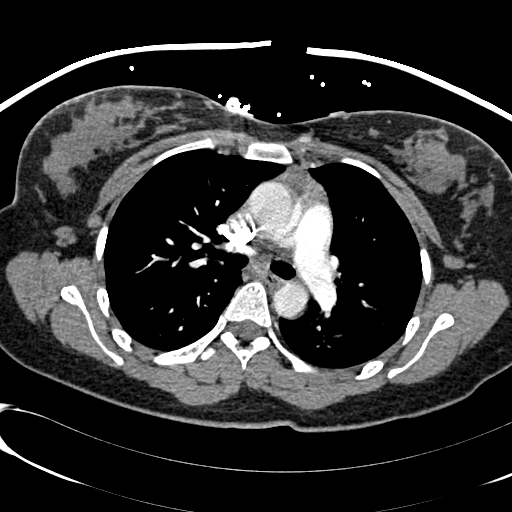
[im 199/285  lung]
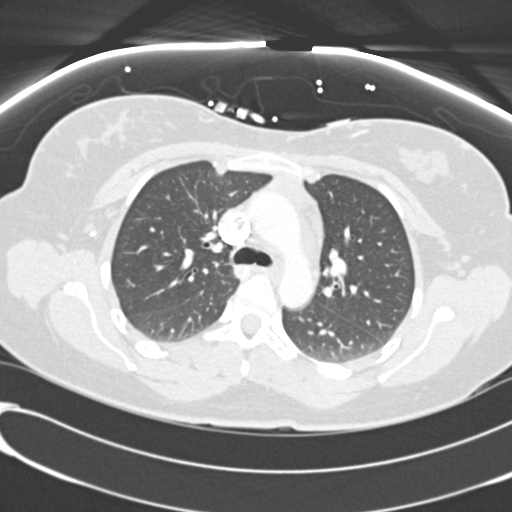
[im 214/285  mediastinal]
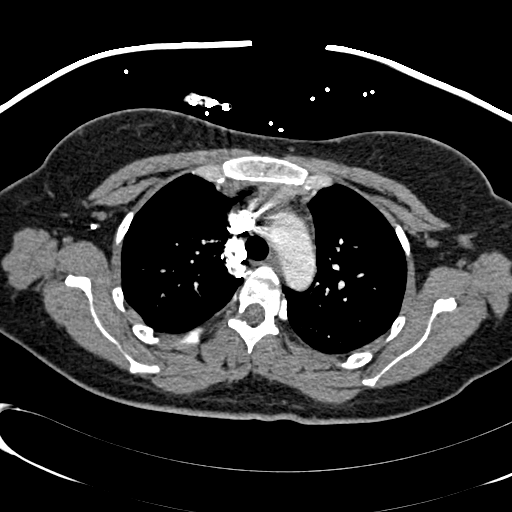
[im 228/285  lung]
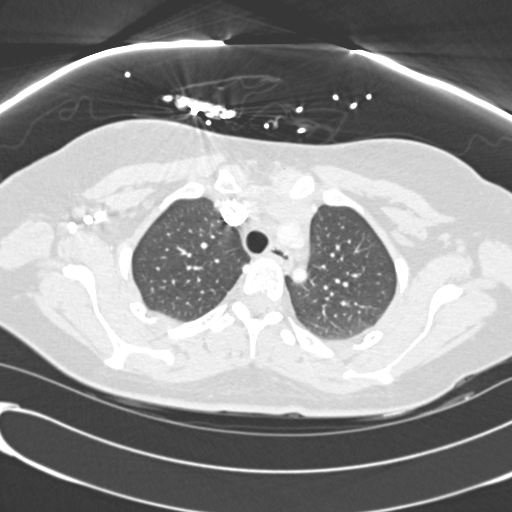
[im 242/285  mediastinal]
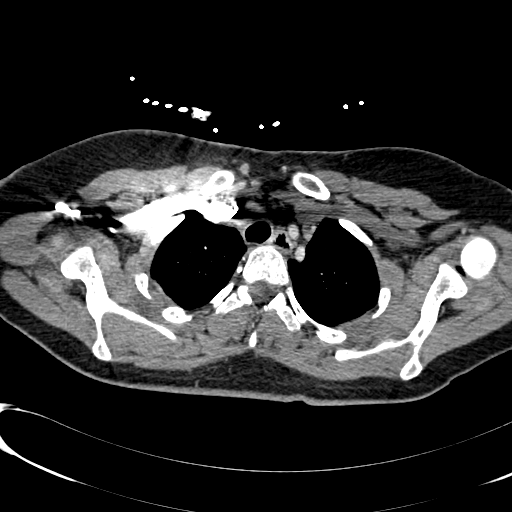
[im 256/285  lung]
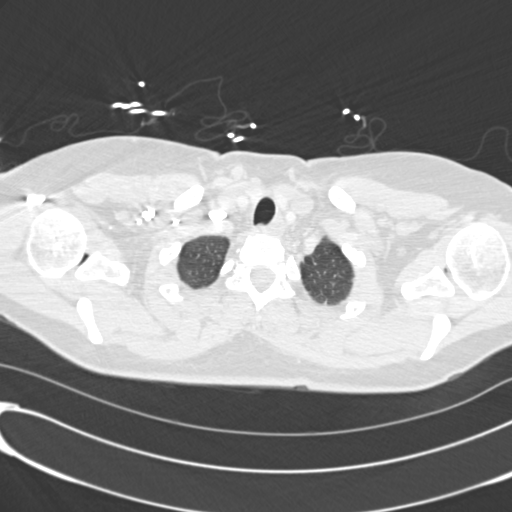
[im 270/285  mediastinal]
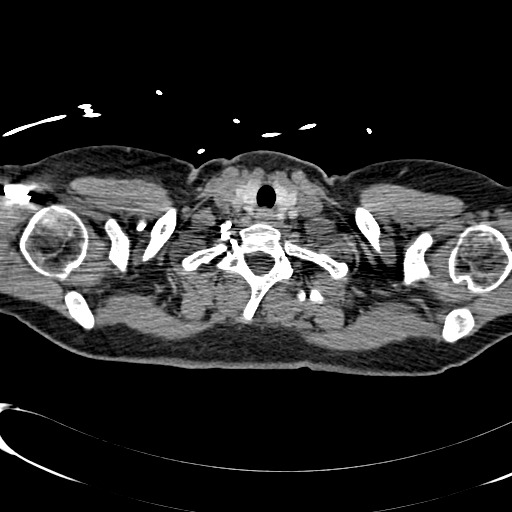

[Series 9: pulm embolism 2.0 spo thins · coronal · 0.63mm/px · 1 of 116 slices shown]
[im 58/116  mediastinal]
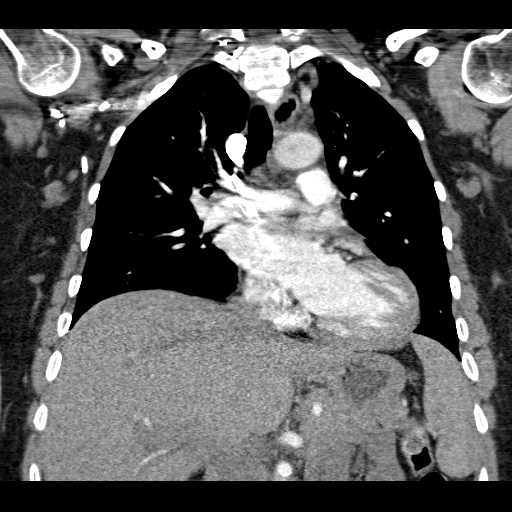

[19 of 36 positions shown; findings below may reference images not displayed]

FINDINGS: The pulmonary arteries are well opacified with contrast.
There is no evidence of acute pulmonary embolism.  The thoracic
aorta appears normal.  The brachiocephalic and left common carotid
arteries share a common origin from the aortic arch.  There is no
mediastinal adenopathy.  Residual thymic tissue is present in the
anterior mediastinum.

The lungs are clear.  There is no pleural or pericardial effusion.

The imaged upper abdomen has a normal appearance.

Review of the MIP images confirms the above findings.
IMPRESSION: No evidence of acute pulmonary embolism or other acute chest
process.

## 2011-05-22 ENCOUNTER — Other Ambulatory Visit: Payer: Self-pay | Admitting: Obstetrics and Gynecology

## 2012-01-09 ENCOUNTER — Other Ambulatory Visit (HOSPITAL_COMMUNITY): Payer: Self-pay | Admitting: Internal Medicine

## 2012-01-09 ENCOUNTER — Ambulatory Visit (HOSPITAL_COMMUNITY)
Admission: RE | Admit: 2012-01-09 | Discharge: 2012-01-09 | Disposition: A | Discharge status: Home / Self Care | Payer: BC Managed Care – PPO | Source: Ambulatory Visit | Attending: Internal Medicine | Admitting: Internal Medicine

## 2012-01-09 DIAGNOSIS — R110 Nausea: Secondary | ICD-10-CM

## 2012-01-09 DIAGNOSIS — R1032 Left lower quadrant pain: Secondary | ICD-10-CM

## 2012-01-09 DIAGNOSIS — K5900 Constipation, unspecified: Secondary | ICD-10-CM | POA: Insufficient documentation

## 2012-01-09 DIAGNOSIS — K429 Umbilical hernia without obstruction or gangrene: Secondary | ICD-10-CM | POA: Insufficient documentation

## 2012-01-09 MED ORDER — IOHEXOL 300 MG/ML  SOLN
100.0000 mL | Freq: Once | INTRAMUSCULAR | Status: AC | PRN
  Administered 2012-01-09: 100 mL via INTRAVENOUS

## 2012-01-15 ENCOUNTER — Other Ambulatory Visit: Payer: Self-pay | Admitting: Internal Medicine

## 2012-01-15 ENCOUNTER — Ambulatory Visit
Admission: RE | Admit: 2012-01-15 | Discharge: 2012-01-15 | Disposition: A | Discharge status: Home / Self Care | Payer: BC Managed Care – PPO | Source: Ambulatory Visit | Attending: Internal Medicine | Admitting: Internal Medicine

## 2012-01-15 DIAGNOSIS — R1032 Left lower quadrant pain: Secondary | ICD-10-CM

## 2012-05-25 ENCOUNTER — Other Ambulatory Visit: Payer: Self-pay | Admitting: Obstetrics and Gynecology

## 2013-06-01 ENCOUNTER — Other Ambulatory Visit: Payer: Self-pay | Admitting: Obstetrics and Gynecology

## 2014-03-15 SURGERY — Surgical Case
Anesthesia: *Unknown

## 2014-03-16 ENCOUNTER — Other Ambulatory Visit: Payer: Self-pay | Admitting: Otolaryngology

## 2014-07-04 ENCOUNTER — Other Ambulatory Visit: Payer: Self-pay | Admitting: Obstetrics and Gynecology

## 2014-07-05 LAB — CYTOLOGY - PAP

## 2014-09-28 DIAGNOSIS — E663 Overweight: Secondary | ICD-10-CM | POA: Insufficient documentation

## 2014-12-26 DIAGNOSIS — M25869 Other specified joint disorders, unspecified knee: Secondary | ICD-10-CM | POA: Insufficient documentation

## 2015-01-31 ENCOUNTER — Ambulatory Visit
Admission: RE | Admit: 2015-01-31 | Discharge: 2015-01-31 | Disposition: A | Discharge status: Home / Self Care | Payer: BC Managed Care – PPO | Source: Ambulatory Visit | Attending: Family Medicine | Admitting: Family Medicine

## 2015-01-31 ENCOUNTER — Other Ambulatory Visit: Payer: Self-pay | Admitting: Family Medicine

## 2015-01-31 DIAGNOSIS — Z86711 Personal history of pulmonary embolism: Secondary | ICD-10-CM | POA: Insufficient documentation

## 2015-01-31 DIAGNOSIS — R079 Chest pain, unspecified: Secondary | ICD-10-CM | POA: Diagnosis not present

## 2015-01-31 DIAGNOSIS — R0602 Shortness of breath: Secondary | ICD-10-CM | POA: Diagnosis present

## 2015-01-31 DIAGNOSIS — Z8709 Personal history of other diseases of the respiratory system: Secondary | ICD-10-CM

## 2015-01-31 MED ORDER — IOHEXOL 350 MG/ML SOLN
100.0000 mL | Freq: Once | INTRAVENOUS | Status: AC | PRN
  Administered 2015-01-31: 100 mL via INTRAVENOUS

## 2015-02-01 ENCOUNTER — Other Ambulatory Visit: Payer: Self-pay | Admitting: Family Medicine

## 2015-02-01 DIAGNOSIS — R079 Chest pain, unspecified: Secondary | ICD-10-CM

## 2016-04-08 DIAGNOSIS — J310 Chronic rhinitis: Secondary | ICD-10-CM | POA: Insufficient documentation

## 2016-04-08 DIAGNOSIS — J338 Other polyp of sinus: Secondary | ICD-10-CM | POA: Insufficient documentation

## 2016-06-16 DIAGNOSIS — M25569 Pain in unspecified knee: Secondary | ICD-10-CM | POA: Insufficient documentation

## 2016-09-09 ENCOUNTER — Other Ambulatory Visit: Payer: Self-pay | Admitting: Obstetrics and Gynecology

## 2016-09-09 DIAGNOSIS — R928 Other abnormal and inconclusive findings on diagnostic imaging of breast: Secondary | ICD-10-CM

## 2016-09-16 ENCOUNTER — Ambulatory Visit
Admission: RE | Admit: 2016-09-16 | Discharge: 2016-09-16 | Disposition: A | Discharge status: Home / Self Care | Payer: BC Managed Care – PPO | Source: Ambulatory Visit | Attending: Obstetrics and Gynecology | Admitting: Obstetrics and Gynecology

## 2016-09-16 DIAGNOSIS — R928 Other abnormal and inconclusive findings on diagnostic imaging of breast: Secondary | ICD-10-CM

## 2017-07-09 DIAGNOSIS — R768 Other specified abnormal immunological findings in serum: Secondary | ICD-10-CM | POA: Insufficient documentation

## 2017-07-09 DIAGNOSIS — L409 Psoriasis, unspecified: Secondary | ICD-10-CM | POA: Insufficient documentation

## 2017-07-09 DIAGNOSIS — M79671 Pain in right foot: Secondary | ICD-10-CM | POA: Insufficient documentation

## 2017-09-04 DIAGNOSIS — E669 Obesity, unspecified: Secondary | ICD-10-CM | POA: Insufficient documentation

## 2017-10-01 ENCOUNTER — Other Ambulatory Visit: Payer: Self-pay | Admitting: Obstetrics and Gynecology

## 2017-10-01 DIAGNOSIS — R928 Other abnormal and inconclusive findings on diagnostic imaging of breast: Secondary | ICD-10-CM

## 2017-10-05 ENCOUNTER — Ambulatory Visit
Admission: RE | Admit: 2017-10-05 | Discharge: 2017-10-05 | Disposition: A | Discharge status: Home / Self Care | Payer: BC Managed Care – PPO | Source: Ambulatory Visit | Attending: Obstetrics and Gynecology | Admitting: Obstetrics and Gynecology

## 2017-10-05 ENCOUNTER — Other Ambulatory Visit: Payer: Self-pay | Admitting: Obstetrics and Gynecology

## 2017-10-05 DIAGNOSIS — R928 Other abnormal and inconclusive findings on diagnostic imaging of breast: Secondary | ICD-10-CM

## 2017-10-05 DIAGNOSIS — R599 Enlarged lymph nodes, unspecified: Secondary | ICD-10-CM

## 2017-10-07 ENCOUNTER — Other Ambulatory Visit: Payer: Self-pay | Admitting: Obstetrics and Gynecology

## 2017-10-07 ENCOUNTER — Ambulatory Visit
Admission: RE | Admit: 2017-10-07 | Discharge: 2017-10-07 | Disposition: A | Discharge status: Home / Self Care | Payer: BC Managed Care – PPO | Source: Ambulatory Visit | Attending: Obstetrics and Gynecology | Admitting: Obstetrics and Gynecology

## 2017-10-07 ENCOUNTER — Other Ambulatory Visit (HOSPITAL_COMMUNITY)
Admission: RE | Admit: 2017-10-07 | Discharge: 2017-10-07 | Disposition: A | Discharge status: Home / Self Care | Payer: BC Managed Care – PPO | Source: Ambulatory Visit | Attending: Obstetrics and Gynecology | Admitting: Obstetrics and Gynecology

## 2017-10-07 DIAGNOSIS — R599 Enlarged lymph nodes, unspecified: Secondary | ICD-10-CM

## 2017-10-09 ENCOUNTER — Other Ambulatory Visit: Payer: Self-pay | Admitting: Obstetrics and Gynecology

## 2017-10-09 DIAGNOSIS — R2231 Localized swelling, mass and lump, right upper limb: Secondary | ICD-10-CM

## 2017-10-12 ENCOUNTER — Other Ambulatory Visit: Payer: Self-pay | Admitting: Obstetrics and Gynecology

## 2017-10-12 DIAGNOSIS — R2231 Localized swelling, mass and lump, right upper limb: Secondary | ICD-10-CM

## 2017-10-16 ENCOUNTER — Ambulatory Visit
Admission: RE | Admit: 2017-10-16 | Discharge: 2017-10-16 | Disposition: A | Discharge status: Home / Self Care | Payer: BC Managed Care – PPO | Source: Ambulatory Visit | Attending: Obstetrics and Gynecology | Admitting: Obstetrics and Gynecology

## 2017-10-16 ENCOUNTER — Other Ambulatory Visit: Payer: BC Managed Care – PPO

## 2017-10-16 DIAGNOSIS — R2231 Localized swelling, mass and lump, right upper limb: Secondary | ICD-10-CM

## 2017-11-26 DIAGNOSIS — K644 Residual hemorrhoidal skin tags: Secondary | ICD-10-CM | POA: Insufficient documentation

## 2017-11-26 DIAGNOSIS — N764 Abscess of vulva: Secondary | ICD-10-CM | POA: Insufficient documentation

## 2017-11-26 DIAGNOSIS — G8929 Other chronic pain: Secondary | ICD-10-CM | POA: Insufficient documentation

## 2017-11-30 ENCOUNTER — Other Ambulatory Visit: Payer: Self-pay | Admitting: Internal Medicine

## 2017-11-30 ENCOUNTER — Other Ambulatory Visit (HOSPITAL_COMMUNITY): Payer: Self-pay | Admitting: Internal Medicine

## 2017-11-30 DIAGNOSIS — R109 Unspecified abdominal pain: Secondary | ICD-10-CM

## 2017-12-07 ENCOUNTER — Ambulatory Visit
Admission: RE | Admit: 2017-12-07 | Discharge: 2017-12-07 | Disposition: A | Discharge status: Home / Self Care | Payer: BC Managed Care – PPO | Source: Ambulatory Visit | Attending: Internal Medicine | Admitting: Internal Medicine

## 2017-12-07 DIAGNOSIS — R109 Unspecified abdominal pain: Secondary | ICD-10-CM | POA: Diagnosis not present

## 2018-01-04 DIAGNOSIS — M722 Plantar fascial fibromatosis: Secondary | ICD-10-CM | POA: Insufficient documentation

## 2018-03-16 ENCOUNTER — Other Ambulatory Visit: Payer: Self-pay | Admitting: Obstetrics and Gynecology

## 2018-03-16 DIAGNOSIS — R928 Other abnormal and inconclusive findings on diagnostic imaging of breast: Secondary | ICD-10-CM

## 2018-04-19 ENCOUNTER — Other Ambulatory Visit: Payer: BC Managed Care – PPO

## 2018-06-10 ENCOUNTER — Other Ambulatory Visit: Payer: Self-pay

## 2018-06-10 ENCOUNTER — Ambulatory Visit
Admission: RE | Admit: 2018-06-10 | Discharge: 2018-06-10 | Disposition: A | Discharge status: Home / Self Care | Payer: BC Managed Care – PPO | Source: Ambulatory Visit | Attending: Obstetrics and Gynecology | Admitting: Obstetrics and Gynecology

## 2018-06-10 DIAGNOSIS — R928 Other abnormal and inconclusive findings on diagnostic imaging of breast: Secondary | ICD-10-CM

## 2018-07-21 DIAGNOSIS — Z9189 Other specified personal risk factors, not elsewhere classified: Secondary | ICD-10-CM | POA: Insufficient documentation

## 2018-09-09 DIAGNOSIS — R591 Generalized enlarged lymph nodes: Secondary | ICD-10-CM | POA: Insufficient documentation

## 2018-09-09 DIAGNOSIS — Z1389 Encounter for screening for other disorder: Secondary | ICD-10-CM | POA: Insufficient documentation

## 2018-09-15 ENCOUNTER — Other Ambulatory Visit: Payer: Self-pay | Admitting: Internal Medicine

## 2018-09-15 DIAGNOSIS — E785 Hyperlipidemia, unspecified: Secondary | ICD-10-CM

## 2018-10-07 ENCOUNTER — Other Ambulatory Visit: Payer: Self-pay | Admitting: Obstetrics and Gynecology

## 2018-10-07 DIAGNOSIS — R928 Other abnormal and inconclusive findings on diagnostic imaging of breast: Secondary | ICD-10-CM

## 2018-10-14 ENCOUNTER — Other Ambulatory Visit: Payer: Self-pay

## 2018-10-14 ENCOUNTER — Ambulatory Visit
Admission: RE | Admit: 2018-10-14 | Discharge: 2018-10-14 | Disposition: A | Discharge status: Home / Self Care | Payer: BC Managed Care – PPO | Source: Ambulatory Visit | Attending: Obstetrics and Gynecology | Admitting: Obstetrics and Gynecology

## 2018-10-14 DIAGNOSIS — R928 Other abnormal and inconclusive findings on diagnostic imaging of breast: Secondary | ICD-10-CM

## 2018-10-21 ENCOUNTER — Ambulatory Visit
Admission: RE | Admit: 2018-10-21 | Discharge: 2018-10-21 | Disposition: A | Discharge status: Home / Self Care | Payer: BC Managed Care – PPO | Source: Ambulatory Visit | Attending: Internal Medicine | Admitting: Internal Medicine

## 2018-10-21 DIAGNOSIS — E785 Hyperlipidemia, unspecified: Secondary | ICD-10-CM

## 2019-04-25 ENCOUNTER — Ambulatory Visit: Payer: Self-pay | Admitting: Podiatry

## 2019-05-09 ENCOUNTER — Ambulatory Visit: Payer: BC Managed Care – PPO | Admitting: Podiatry

## 2019-05-09 ENCOUNTER — Encounter: Payer: Self-pay | Admitting: Podiatry

## 2019-05-09 ENCOUNTER — Other Ambulatory Visit: Payer: Self-pay

## 2019-05-09 ENCOUNTER — Ambulatory Visit (INDEPENDENT_AMBULATORY_CARE_PROVIDER_SITE_OTHER): Payer: BC Managed Care – PPO

## 2019-05-09 VITALS — Temp 98.0°F

## 2019-05-09 DIAGNOSIS — M722 Plantar fascial fibromatosis: Secondary | ICD-10-CM

## 2019-05-09 MED ORDER — ETODOLAC ER 500 MG PO TB24: 500.0000 mg | ORAL_TABLET | Freq: Every day | ORAL | 3 refills | Status: DC

## 2019-05-09 MED ORDER — METHYLPREDNISOLONE 4 MG PO TBPK: ORAL_TABLET | ORAL | 0 refills | Status: DC

## 2019-05-09 NOTE — Patient Instructions (Signed)

## 2019-05-09 NOTE — Progress Notes (Signed)
  Subjective:  Patient ID: [name], female    DOB: 04/11/1975,  MRN: [id number] HPI Chief Complaint  Patient presents with  . Foot Pain    Plantar arch and heel bilateral - aching since summer 2019, AM pain, had massages, ultrasound treatment, PCP has on etodolac currently and they aren't as bad as they have been  . New Patient (Initial Visit)    44 y.o. female presents with the above complaint.   ROS: Denies fever chills nausea vomiting muscle aches pains calf pain back pain chest pain shortness of breath.  Past Medical History:  Diagnosis Date  . Hyperlipidemia   . PE (physical exam), annual   . S/P sinus surgery    Past Surgical History:  Procedure Laterality Date  . DILATION AND CURETTAGE OF UTERUS     x3  . SINUS SURGERY WITH INSTATRAK     x3    Current Outpatient Medications:  .  fluticasone (FLONASE) 50 MCG/ACT nasal spray, Place into both nostrils daily., Disp: , Rfl:  .  linaCLOtide (LINZESS PO), Take by mouth., Disp: , Rfl:  .  Cholecalciferol 50 MCG (2000 UT) TABS, Vitamin D3 50 mcg (2,000 unit) tablet, Disp: , Rfl:  .  etodolac (LODINE XL) 500 MG 24 hr tablet, Take 1 tablet (500 mg total) by mouth daily., Disp: 30 tablet, Rfl: 3 .  levothyroxine (SYNTHROID) 75 MCG tablet, Take 75 mcg by mouth daily., Disp: , Rfl:  .  methylPREDNISolone (MEDROL DOSEPAK) 4 MG TBPK tablet, 6 day dose pack - take as directed, Disp: 21 tablet, Rfl: 0 .  montelukast (SINGULAIR) 10 MG tablet, Take 10 mg by mouth daily., Disp: , Rfl:   Allergies  Allergen Reactions  . Ciprofloxacin Anaphylaxis    Other reaction(s): Cough, Cough (ALLERGY/intolerance)  . Penicillins    Review of Systems Objective:   Vitals:   05/09/19 1620  Temp: 50 F (36.7 C)    General: Well developed, nourished, in no acute distress, alert and oriented x3   Dermatological: Skin is warm, dry and supple bilateral. Nails x 10 are well maintained; remaining integument appears unremarkable at this time.  There are no open sores, no preulcerative lesions, no rash or signs of infection present.  Vascular: Dorsalis Pedis artery and Posterior Tibial artery pedal pulses are 2/4 bilateral with immedate capillary fill time. Pedal hair growth present. No varicosities and no lower extremity edema present bilateral.   Neruologic: Grossly intact via light touch bilateral. Vibratory intact via tuning fork bilateral. Protective threshold with Semmes Wienstein monofilament intact to all pedal sites bilateral. Patellar and Achilles deep tendon reflexes 2+ bilateral. No Babinski or clonus noted bilateral.   Musculoskeletal: No gross boney pedal deformities bilateral. No pain, crepitus, or limitation noted with foot and ankle range of motion bilateral. Muscular strength 5/5 in all groups tested bilateral.  She is tender palpation medial calcaneal tubercles bilateral left greater than right.  Gait: Unassisted, Nonantalgic.    Radiographs:  Demonstrates soft tissue increase in density at the plantar fashion calcaneal insertion site minimal spurring is noted.  Mild pes planus is noted.  Assessment & Plan:   Assessment: Plantar fasciitis bilateral.  Plan: Discussed etiology pathology and surgical therapies at this point start her on methylprednisolone to be followed by her etodolac.  Plan fascial braces and a night splint bilateral injected the bilateral heels today tolerated procedure without complications follow-up with her in 1 month     Ramie Palladino T. [place], [place]

## 2019-06-08 ENCOUNTER — Ambulatory Visit: Payer: BC Managed Care – PPO | Admitting: Podiatry

## 2019-06-08 ENCOUNTER — Other Ambulatory Visit: Payer: Self-pay

## 2019-06-08 ENCOUNTER — Encounter: Payer: Self-pay | Admitting: Podiatry

## 2019-06-08 DIAGNOSIS — M722 Plantar fascial fibromatosis: Secondary | ICD-10-CM

## 2019-06-08 NOTE — Progress Notes (Signed)
She presents today for follow-up of her bilateral heels.  States they still have the movements for the most part there is no pain any longer.  Objective: Vital signs are stable alert oriented x3.  Pulses are palpable.  She has no pain on palpation medial care tubercle.  Assessment: Resolving plantar fasciitis bilateral.  Plan: Continue the use of the etodolac try to taper off of that over the next few weeks we are going to add stretching exercises to her regimen daily continue the plantar fascial brace during the day and discontinue the plantar fascial night splint.  I will follow-up with her in a month if not improved.

## 2019-06-08 NOTE — Patient Instructions (Signed)

## 2019-12-07 DIAGNOSIS — J0141 Acute recurrent pansinusitis: Secondary | ICD-10-CM | POA: Insufficient documentation

## 2019-12-23 IMAGING — US US AXILLARY RIGHT
1 series · 13 of 13 positions shown · non-contrast
Comparison: Prior exams.

CLINICAL DATA: Screening recall for a possible right axillary mass.

EXAM:
ULTRASOUND OF THE RIGHT AXILLA

[Series 1: us axillary right · 0.07mm/px · 13 of 13 slices shown]
[im 1/13]
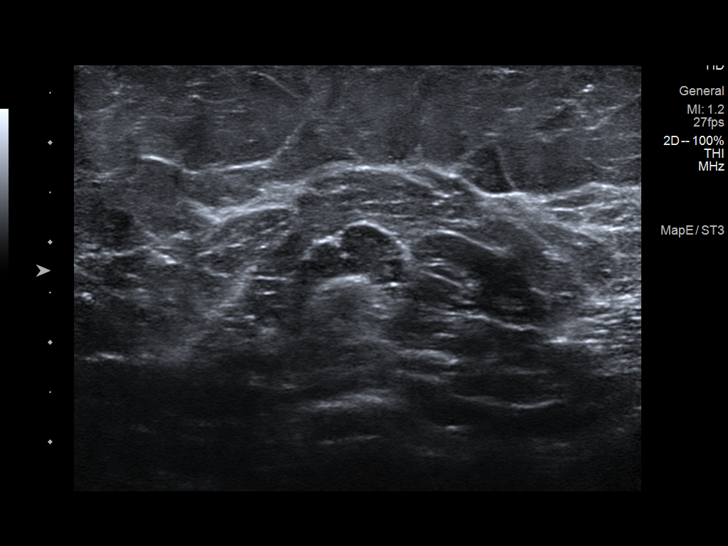
[im 2/13]
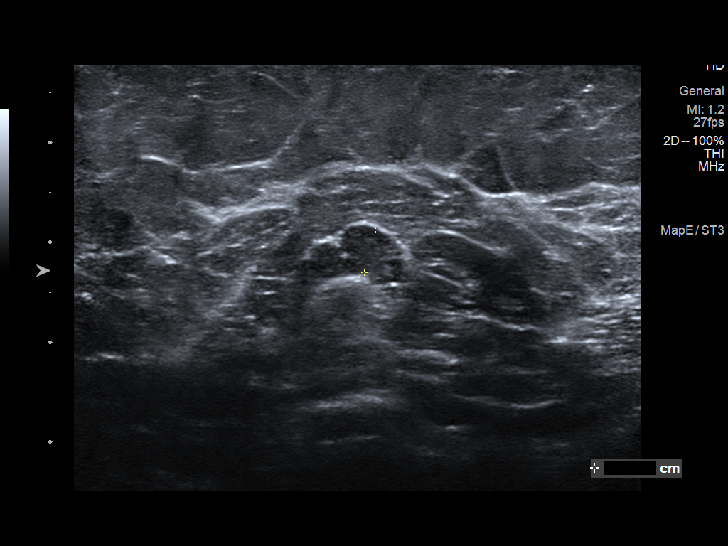
[im 3/13]
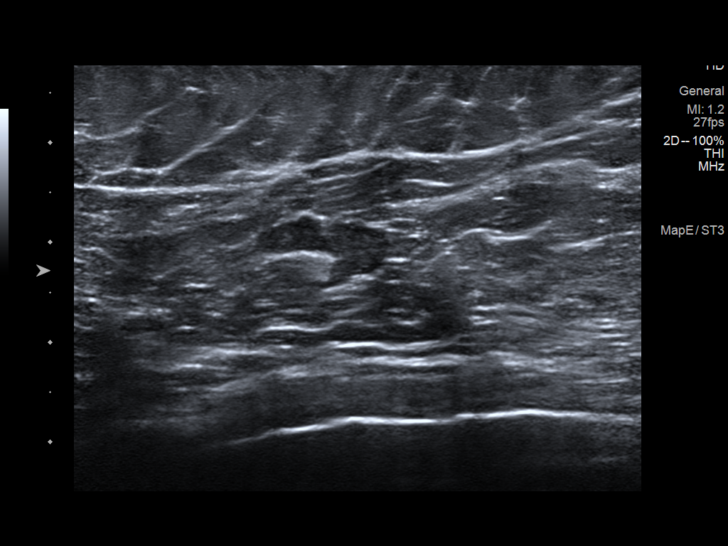
[im 4/13]
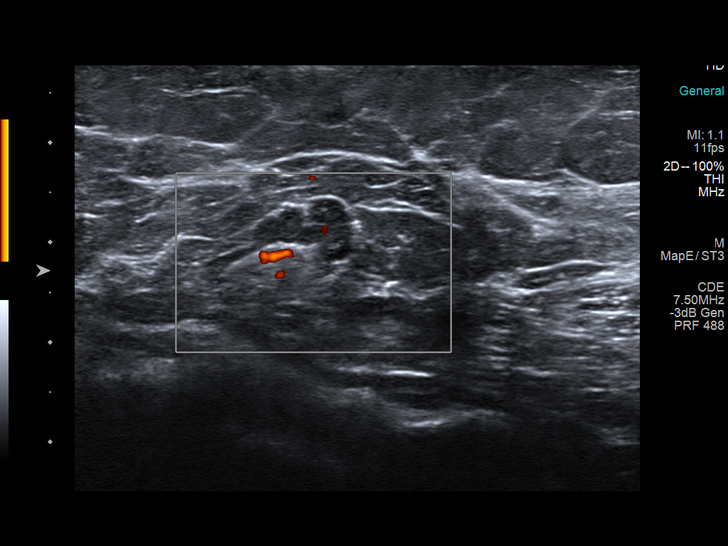
[im 5/13]
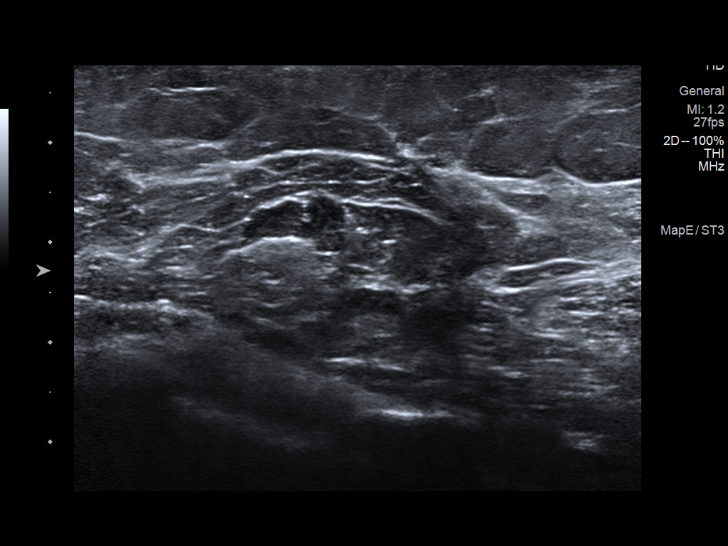
[im 6/13]
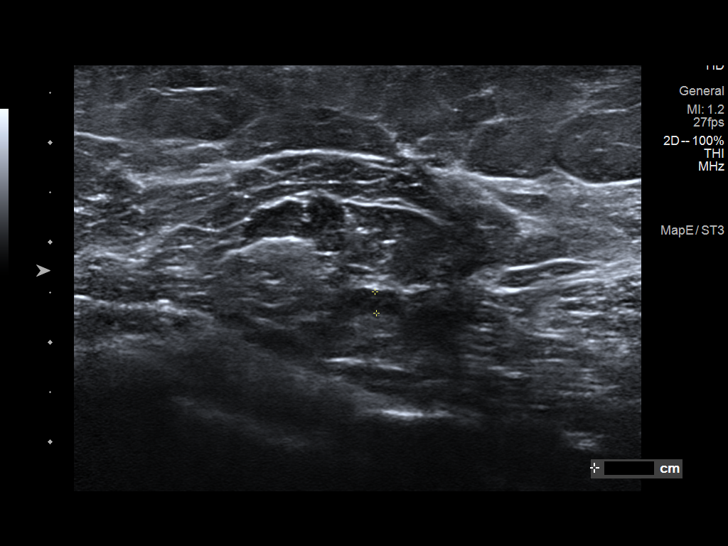
[im 7/13]
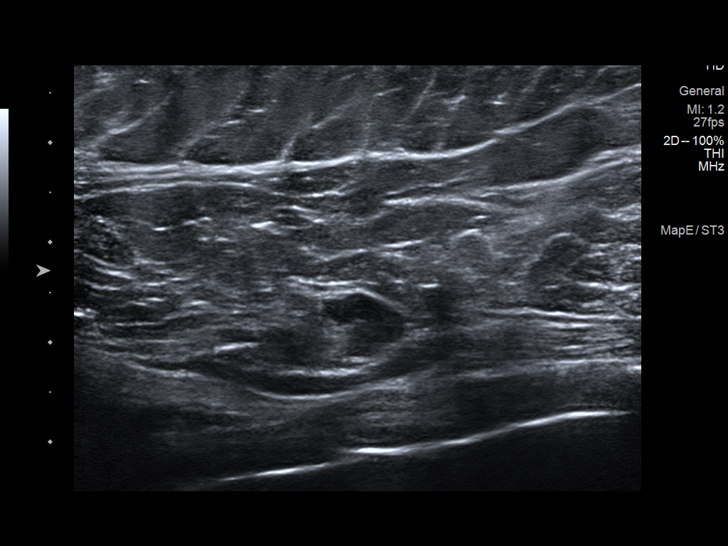
[im 8/13]
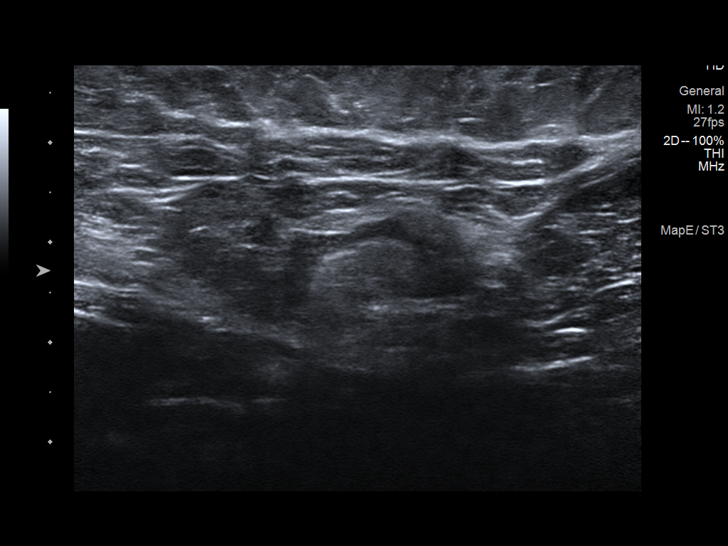
[im 9/13]
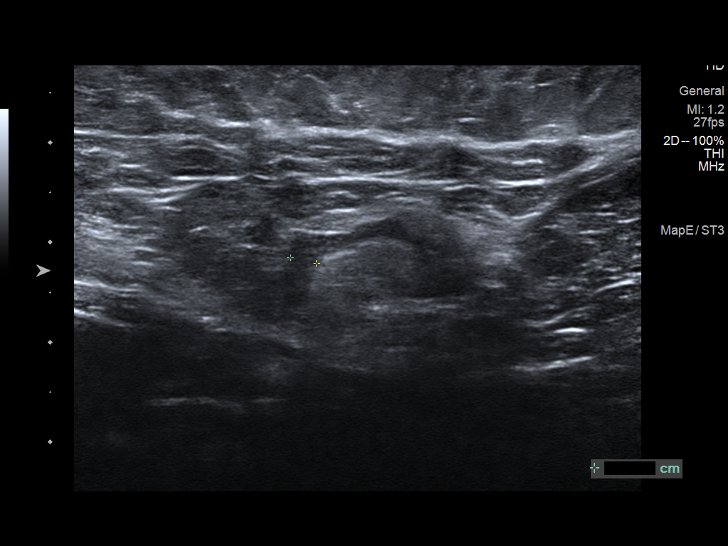
[im 10/13]
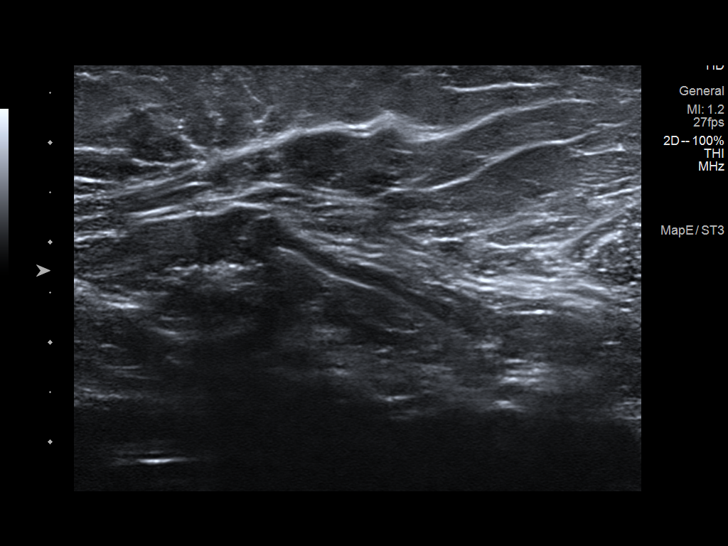
[im 11/13]
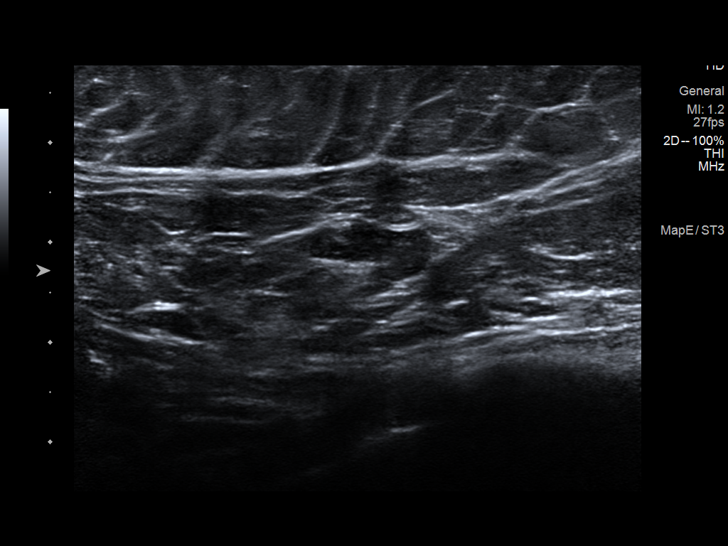
[im 12/13]
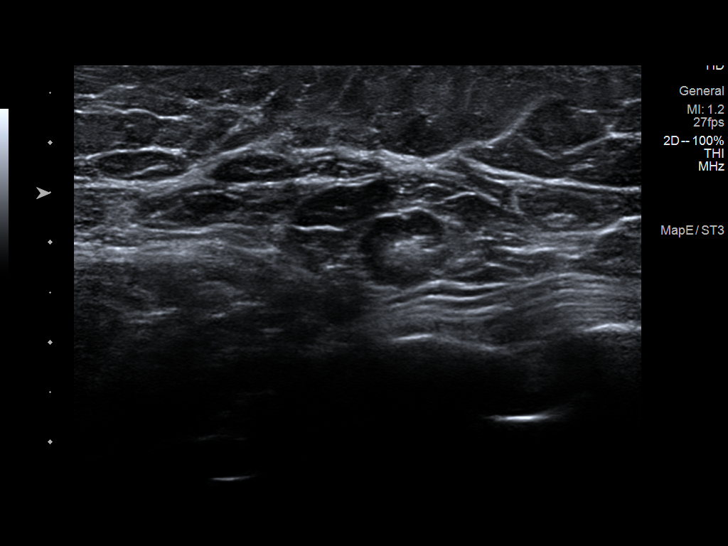
[im 13/13]
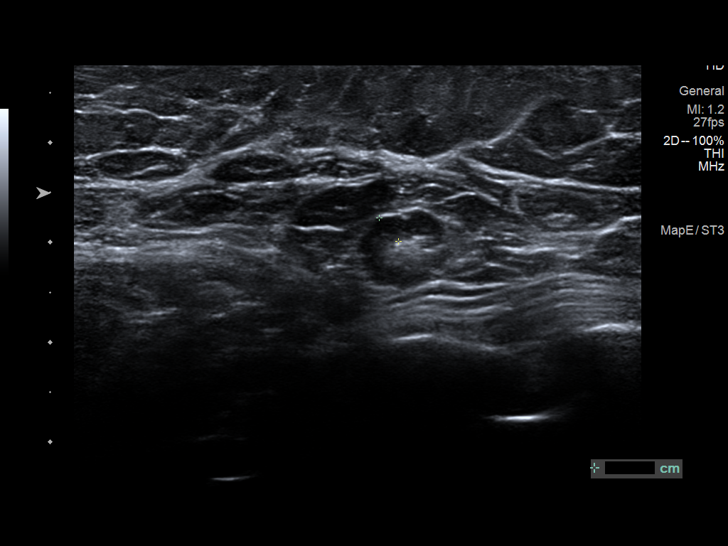

[13 of 13 positions shown; findings below may reference images not displayed]

FINDINGS: Ultrasound of the right axilla demonstrates a right lower axillary
lymph node with cortex measuring up to 4 mm. The cortex of this
lymph node does undulate, and is difficult to tell whether this
focally thickened area of lymph node is due to undulation or true
thickening. Ultrasound of the left axilla was performed for
comparison demonstrating normal-appearing lymph nodes. One of them
does demonstrate a mild undulating pattern as well.
IMPRESSION: There is an indeterminate lymph node in the low right axilla.

RECOMMENDATION:
Ultrasound guided biopsy is recommended for the right axillary lymph
node. Samples should be sent in formalin and saline to rule out
lymphoma. This has been scheduled for 10/07/2017 at [DATE] a.m..

I have discussed the findings and recommendations with the patient.
Results were also provided in writing at the conclusion of the
visit. If applicable, a reminder letter will be sent to the patient
regarding the next appointment.

BI-RADS CATEGORY  4: Suspicious.

## 2019-12-25 IMAGING — MG MM CLIP PLACEMENT
1 series · 1 of 1 positions shown · non-contrast
Comparison: Previous exam(s).

CLINICAL DATA: Evaluate biopsy marker

EXAM:
DIAGNOSTIC RIGHT MAMMOGRAM POST ULTRASOUND BIOPSY

[R MLO]
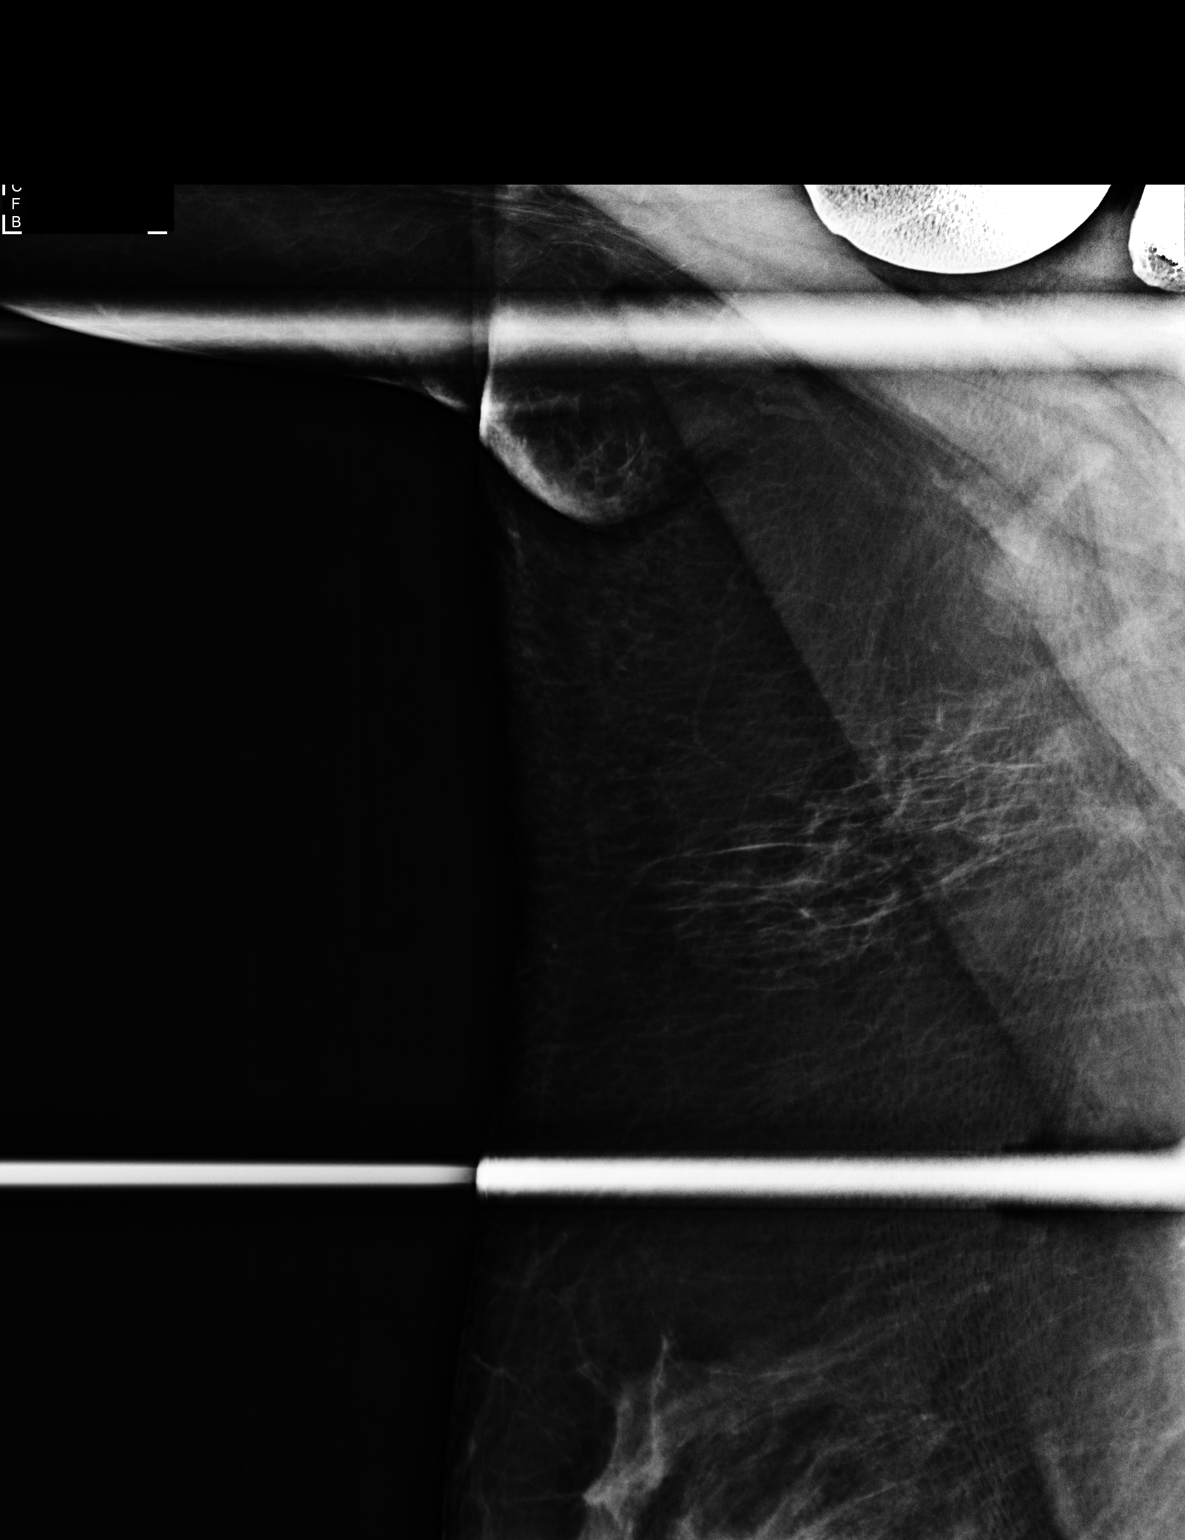

[1 of 1 positions shown; findings below may reference images not displayed]

FINDINGS: Mammographic images were obtained following ultrasound guided biopsy
of a right axillary node. The biopsy clip is not identified. The
biopsied node is not clearly visualized, likely pushed out of the
field of view. There is lidocaine in the soft tissues in the
expected location of the biopsied node. Review of the biopsy images
and the ultrasound from October 05, 2017 ensure the
sonographically identified abnormal node was biopsied today, no
other abnormal nodes were identified, and a clip appears to have
definitively been placed within the node.
IMPRESSION: The clip cannot be seen mammographically.

Final Assessment: Post Procedure Mammograms for Marker Placement

## 2020-01-03 ENCOUNTER — Other Ambulatory Visit: Payer: Self-pay | Admitting: Podiatry

## 2020-01-03 NOTE — Telephone Encounter (Signed)
Please Advise

## 2020-01-18 DIAGNOSIS — S63502A Unspecified sprain of left wrist, initial encounter: Secondary | ICD-10-CM | POA: Insufficient documentation

## 2020-01-18 DIAGNOSIS — S62524A Nondisplaced fracture of distal phalanx of right thumb, initial encounter for closed fracture: Secondary | ICD-10-CM | POA: Insufficient documentation

## 2020-01-23 ENCOUNTER — Other Ambulatory Visit: Payer: No Typology Code available for payment source

## 2020-01-23 DIAGNOSIS — Z20822 Contact with and (suspected) exposure to covid-19: Secondary | ICD-10-CM

## 2020-01-24 LAB — SARS-COV-2, NAA 2 DAY TAT

## 2020-01-24 LAB — SPECIMEN STATUS REPORT

## 2020-01-24 LAB — NOVEL CORONAVIRUS, NAA: SARS-CoV-2, NAA: DETECTED — AB

## 2020-03-12 ENCOUNTER — Other Ambulatory Visit: Payer: Self-pay

## 2020-03-12 ENCOUNTER — Encounter: Payer: Self-pay | Admitting: Podiatry

## 2020-03-12 ENCOUNTER — Ambulatory Visit: Payer: BC Managed Care – PPO | Admitting: Podiatry

## 2020-03-12 DIAGNOSIS — K6289 Other specified diseases of anus and rectum: Secondary | ICD-10-CM | POA: Insufficient documentation

## 2020-03-12 DIAGNOSIS — K645 Perianal venous thrombosis: Secondary | ICD-10-CM | POA: Insufficient documentation

## 2020-03-12 DIAGNOSIS — K5900 Constipation, unspecified: Secondary | ICD-10-CM | POA: Insufficient documentation

## 2020-03-12 DIAGNOSIS — R1013 Epigastric pain: Secondary | ICD-10-CM | POA: Insufficient documentation

## 2020-03-12 DIAGNOSIS — K600 Acute anal fissure: Secondary | ICD-10-CM | POA: Insufficient documentation

## 2020-03-12 DIAGNOSIS — M722 Plantar fascial fibromatosis: Secondary | ICD-10-CM | POA: Diagnosis not present

## 2020-03-12 MED ORDER — METHYLPREDNISOLONE 4 MG PO TBPK: ORAL_TABLET | ORAL | 0 refills | Status: DC

## 2020-03-12 MED ORDER — ETODOLAC ER 500 MG PO TB24: ORAL_TABLET | ORAL | 2 refills | Status: DC

## 2020-03-12 MED ORDER — TRIAMCINOLONE ACETONIDE 40 MG/ML IJ SUSP
40.0000 mg | Freq: Once | INTRAMUSCULAR | Status: AC
  Administered 2020-03-12: 40 mg

## 2020-03-12 NOTE — Progress Notes (Signed)
She presents today for follow-up of her plantar fasciitis.  States that his flared up again over the past 2 to 3 months.  She continues to take her medication at night when she takes it.  She continues to wear her braces and use her night splint.  Objective: Vital signs are stable she is alert and oriented x3 pulses are palpable.  He has pain on palpation medial calcaneal tubercles bilaterally.  Not as severe as they have been in the past.  Assessment: Plan fasciitis bilateral.  Plan: Injected the bilateral heels today she will start her Medrol Dosepak before she goes to [employer].  However I am going to recommend that she get started on her Lodine.  I recommended that she take it in the morning so be a available by the end of the day to help with her discomfort.

## 2021-01-02 ENCOUNTER — Other Ambulatory Visit: Payer: Self-pay | Admitting: Obstetrics and Gynecology

## 2021-01-02 DIAGNOSIS — R928 Other abnormal and inconclusive findings on diagnostic imaging of breast: Secondary | ICD-10-CM

## 2021-02-07 ENCOUNTER — Ambulatory Visit
Admission: RE | Admit: 2021-02-07 | Discharge: 2021-02-07 | Disposition: A | Discharge status: Home / Self Care | Payer: BC Managed Care – PPO | Source: Ambulatory Visit | Attending: Obstetrics and Gynecology | Admitting: Obstetrics and Gynecology

## 2021-02-07 DIAGNOSIS — R928 Other abnormal and inconclusive findings on diagnostic imaging of breast: Secondary | ICD-10-CM

## 2021-04-07 ENCOUNTER — Ambulatory Visit
Admission: EM | Admit: 2021-04-07 | Discharge: 2021-04-07 | Disposition: A | Discharge status: Home / Self Care | Payer: BC Managed Care – PPO | Attending: Emergency Medicine | Admitting: Emergency Medicine

## 2021-04-07 ENCOUNTER — Encounter: Payer: Self-pay | Admitting: Emergency Medicine

## 2021-04-07 DIAGNOSIS — J020 Streptococcal pharyngitis: Secondary | ICD-10-CM

## 2021-04-07 LAB — POCT RAPID STREP A (OFFICE): Rapid Strep A Screen: POSITIVE — AB

## 2021-04-07 MED ORDER — CEFDINIR 300 MG PO CAPS: 300.0000 mg | ORAL_CAPSULE | Freq: Two times a day (BID) | ORAL | 0 refills | Status: AC

## 2021-04-07 NOTE — ED Provider Notes (Signed)
?[facility] ? ? ? ?CSN: [id number] ?Arrival date & time: 04/07/21  0920 ? ? ?  ? ?History   ?Chief Complaint ?Chief Complaint  ?Patient presents with  ? Sore Throat  ?  Terrible sore throat, fever, chills, body aches - Entered by patient  ? ? ?HPI ?[name] is a 46 y.o. female.  Patient presents with sore throat, chills, and fever.  Tmax 100.  She denies rash, cough, shortness of breath, vomiting, diarrhea, or other symptoms.  Treatment at home with OTC pain reliever.  Her medical history includes chronic rhinitis and sinus surgery. ? ?The history is provided by the patient and medical records.  ? ?Past Medical History:  ?Diagnosis Date  ? Hyperlipidemia   ? PE (physical exam), annual   ? S/P sinus surgery   ? ? ?Patient Active Problem List  ? Diagnosis Date Noted  ? Acute anal fissure 03/12/2020  ? Constipation 03/12/2020  ? Epigastric pain 03/12/2020  ? 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 Thrombosed external hemorrhoids 03/12/2020  ? Closed nondisplaced fracture of distal phalanx of right thumb 01/18/2020  ? Wrist sprain, left, initial encounter 01/18/2020  ? Acute recurrent pansinusitis 12/07/2019  ? Plantar fasciitis of left foot 01/04/2018  ? Bilateral foot pain 07/09/2017  ? Positive ANA (antinuclear antibody) 07/09/2017  ? Psoriasis 07/09/2017  ? Chronic rhinitis 04/08/2016  ? Nasal sinus polyp 04/08/2016  ? Other specified disorders of lower leg joint 12/26/2014  ? BRADYCARDIA 09/14/2008  ? SHORTNESS OF BREATH 09/14/2008  ? CHEST PAIN-UNSPECIFIED 09/14/2008  ? ? ?Past Surgical History:  ?Procedure Laterality Date  ? DILATION AND CURETTAGE OF UTERUS    ? x3  ? SINUS SURGERY WITH INSTATRAK    ? x3  ? ? ?OB History   ?No obstetric history on file. ?  ? ? ? ?Home Medications   ? ?Prior to Admission medications   ?Medication Sig Start Date End Date Taking? Authorizing Provider  ?cefdinir (OMNICEF) 300 MG capsule Take 1 capsule (300 mg total) by mouth 2 (two) times daily for 10 days. 04/07/21 04/17/21 Yes Sharion Balloon, NP  ?Cholecalciferol 50 MCG (2000 UT) TABS Vitamin D3 50 mcg (2,000 unit) tablet    [provider]  ?etodolac (LODINE XL) 500 MG 24 hr tablet TAKE 1 TABLET(500 MG) BY MOUTH DAILY 03/12/20   Hyatt, Max T, DPM  ?fluticasone (FLONASE) 50 MCG/ACT nasal spray Place into both nostrils daily.    [provider]  ?levothyroxine (SYNTHROID) 75 MCG tablet Take 75 mcg by mouth daily. 04/01/19   [provider]  ?linaCLOtide (LINZESS PO) Take by mouth.    [provider]  ?Liraglutide -Weight Management (SAXENDA) 18 MG/3ML SOPN Inject into the skin.    [provider]  ?methylPREDNISolone (MEDROL DOSEPAK) 4 MG TBPK tablet 6 day dose pack - take as directed 03/12/20   Hyatt, Max T, DPM  ?montelukast (SINGULAIR) 10 MG tablet Take 10 mg by mouth daily. 04/08/19   [provider]  ? ? ?Family History ?Family History  ?Problem Relation Age of Onset  ? Other Father   ?     MS  ? Hyperlipidemia Mother   ? Osteoarthritis Mother   ? Breast cancer Maternal Grandmother   ? Coronary artery disease Neg Hx   ? ? ?Social History ?Social History  ? ?Tobacco Use  ? Smoking status: Never  ? Smokeless tobacco: Never  ?Substance Use Topics  ? Alcohol use: No  ? Drug use: No  ? ? ? ?  Allergies   ?Ciprofloxacin and Penicillins ? ? ?Review of Systems ?Review of Systems  ?Constitutional:  Positive for chills and fever.  ?HENT:  Positive for sore throat. Negative for ear pain.   ?Respiratory:  Negative for cough and shortness of breath.   ?Gastrointestinal:  Negative for diarrhea and vomiting.  ?Skin:  Negative for color change and rash.  ?All other systems reviewed and are negative. ? ? ?Physical Exam ?Triage Vital Signs ?ED Triage Vitals  ?Enc Vitals Group  ?   BP   ?   Pulse   ?   Resp   ?   Temp   ?   Temp src   ?   SpO2   ?   Weight   ?   Height   ?   Head Circumference   ?   Peak Flow   ?   Pain Score   ?   Pain Loc   ?   Pain Edu?   ?   Excl. in [place]?   ? ?No data found. ? ?Updated Vital  Signs ?BP 110/71   Pulse 76   Temp 97.9 ?F (36.6 ?C)   Resp 18   SpO2 97%  ? ?Visual Acuity ?Right Eye Distance:   ?Left Eye Distance:   ?Bilateral Distance:   ? ?Right Eye Near:   ?Left Eye Near:    ?Bilateral Near:    ? ?Physical Exam ?Vitals and nursing note reviewed.  ?Constitutional:   ?   General: She is not in acute distress. ?   Appearance: She is well-developed.  ?HENT:  ?   Right Ear: Tympanic membrane normal.  ?   Left Ear: Tympanic membrane normal.  ?   Nose: Nose normal.  ?   Mouth/Throat:  ?   Mouth: Mucous membranes are moist.  ?   Pharynx: Posterior oropharyngeal erythema present.  ?   Tonsils: 2+ on the right. 2+ on the left.  ?Cardiovascular:  ?   Rate and Rhythm: Normal rate and regular rhythm.  ?   Heart sounds: Normal heart sounds.  ?Pulmonary:  ?   Effort: Pulmonary effort is normal. No respiratory distress.  ?   Breath sounds: Normal breath sounds.  ?Musculoskeletal:  ?   Cervical back: Neck supple.  ?Skin: ?   General: Skin is warm and dry.  ?Neurological:  ?   Mental Status: She is alert.  ?Psychiatric:     ?   Mood and Affect: Mood normal.     ?   Behavior: Behavior normal.  ? ? ? ?UC Treatments / Results  ?Labs ?(all labs ordered are listed, but only abnormal results are displayed) ?Labs Reviewed  ?POCT RAPID STREP A (OFFICE) - Abnormal; Notable for the following components:  ?    Result Value  ? Rapid Strep A Screen Positive (*)   ? All other components within normal limits  ? ? ?EKG ? ? ?Radiology ?No results found. ? ?Procedures ?Procedures (including critical care time) ? ?Medications Ordered in UC ?Medications - No data to display ? ?Initial Impression / Assessment and Plan / UC Course  ?I have reviewed the triage vital signs and the nursing notes. ? ?Pertinent labs & imaging results that were available during my care of the patient were reviewed by me and considered in my medical decision making (see chart for details). ? ?Strep pharyngitis.  Rapid strep positive.  Patient is  allergic to penicillin; she believes this caused a rash as a child.  Treating with cefdinir; Discussed potential for cross allergy.  Discussed symptomatic treatment including Tylenol or ibuprofen.  Instructed patient to follow up with her PCP if her symptoms are not improving.  She agrees to plan of care.  ? ? ? ?Final Clinical Impressions(s) / UC Diagnoses  ? ?Final diagnoses:  ?Strep pharyngitis  ? ? ? ?Discharge Instructions   ? ?  ?Take the cefdinir as directed.  Follow up with your primary care provider if your symptoms are not improving.   ? ? ? ? ? ?ED Prescriptions   ? ? Medication Sig Dispense Auth. Provider  ? cefdinir (OMNICEF) 300 MG capsule Take 1 capsule (300 mg total) by mouth 2 (two) times daily for 10 days. 20 capsule Sharion Balloon, NP  ? ?  ? ?PDMP not reviewed this encounter. ?  ?Sharion Balloon, NP ?04/07/21 [phone] ? ?

## 2021-04-07 NOTE — ED Triage Notes (Signed)
Pt here with sore throat x 2 days with a fever yesterday and chills.  ?

## 2021-04-07 NOTE — Discharge Instructions (Addendum)
Take the cefdinir as directed.  Follow up with your primary care provider if your symptoms are not improving.    

## 2021-04-19 ENCOUNTER — Ambulatory Visit
Admission: EM | Admit: 2021-04-19 | Discharge: 2021-04-19 | Disposition: A | Discharge status: Home / Self Care | Payer: BC Managed Care – PPO | Attending: Emergency Medicine | Admitting: Emergency Medicine

## 2021-04-19 ENCOUNTER — Encounter: Payer: Self-pay | Admitting: Emergency Medicine

## 2021-04-19 DIAGNOSIS — Z23 Encounter for immunization: Secondary | ICD-10-CM | POA: Diagnosis not present

## 2021-04-19 DIAGNOSIS — S61012A Laceration without foreign body of left thumb without damage to nail, initial encounter: Secondary | ICD-10-CM

## 2021-04-19 MED ORDER — TETANUS-DIPHTH-ACELL PERTUSSIS 5-2.5-18.5 LF-MCG/0.5 IM SUSY
0.5000 mL | PREFILLED_SYRINGE | Freq: Once | INTRAMUSCULAR | Status: AC
  Administered 2021-04-19: 0.5 mL via INTRAMUSCULAR

## 2021-04-19 NOTE — Discharge Instructions (Addendum)
Your stitches need to be removed in 7-10 days.   ? ?Your tetanus was updated today.   ? ?Keep your wound clean and dry.  Wash it gently twice a day with soap and water.  Apply an antibiotic cream and bandage twice a day. After a couple of days, leave it open to air.   ? ?Return here if you see signs of infection, such as increased pain, redness, pus-like drainage, warmth, fever, chills, or other concerning symptoms.   ? ?

## 2021-04-19 NOTE — ED Triage Notes (Signed)
Pt here with laceration to left thumb tip about 30 minutes ago from knife while cutting potatoes. Actively bleeding in triage. ?

## 2021-04-19 NOTE — ED Provider Notes (Signed)
?[facility] ? ? ? ?CSN: [id number] ?Arrival date & time: 04/19/21  1936 ? ? ?  ? ?History   ?Chief Complaint ?Chief Complaint  ?Patient presents with  ? Finger Injury  ?  Cur thumb with a knife   ? ? ?HPI ?[name] is a 46 y.o. female.  Patient presents with a laceration on the tip of her left thumb that occurred just prior to arrival.  She cut her thumb while cutting potatoes.  Bleeding controlled with direct pressure.  No numbness, weakness, paresthesias, or other symptoms.  Last tetanus approximately 10 years ago. ? ?The history is provided by the patient.  ? ?Past Medical History:  ?Diagnosis Date  ? Hyperlipidemia   ? PE (physical exam), annual   ? S/P sinus surgery   ? ? ?Patient Active Problem List  ? Diagnosis Date Noted  ? Acute anal fissure 03/12/2020  ? Constipation 03/12/2020  ? Epigastric pain 03/12/2020  ? 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 Thrombosed external hemorrhoids 03/12/2020  ? Closed nondisplaced fracture of distal phalanx of right thumb 01/18/2020  ? Wrist sprain, left, initial encounter 01/18/2020  ? Acute recurrent pansinusitis 12/07/2019  ? Plantar fasciitis of left foot 01/04/2018  ? Bilateral foot pain 07/09/2017  ? Positive ANA (antinuclear antibody) 07/09/2017  ? Psoriasis 07/09/2017  ? Chronic rhinitis 04/08/2016  ? Nasal sinus polyp 04/08/2016  ? Other specified disorders of lower leg joint 12/26/2014  ? BRADYCARDIA 09/14/2008  ? SHORTNESS OF BREATH 09/14/2008  ? CHEST PAIN-UNSPECIFIED 09/14/2008  ? ? ?Past Surgical History:  ?Procedure Laterality Date  ? DILATION AND CURETTAGE OF UTERUS    ? x3  ? SINUS SURGERY WITH INSTATRAK    ? x3  ? ? ?OB History   ?No obstetric history on file. ?  ? ? ? ?Home Medications   ? ?Prior to Admission medications   ?Medication Sig Start Date End Date Taking? Authorizing Provider  ?Cholecalciferol 50 MCG (2000 UT) TABS Vitamin D3 50 mcg (2,000 unit) tablet    [provider]  ?etodolac (LODINE XL) 500 MG 24 hr tablet TAKE 1 TABLET(500  MG) BY MOUTH DAILY 03/12/20   Hyatt, Max T, DPM  ?fluticasone (FLONASE) 50 MCG/ACT nasal spray Place into both nostrils daily.    [provider]  ?levothyroxine (SYNTHROID) 75 MCG tablet Take 75 mcg by mouth daily. 04/01/19   [provider]  ?linaCLOtide (LINZESS PO) Take by mouth.    [provider]  ?Liraglutide -Weight Management (SAXENDA) 18 MG/3ML SOPN Inject into the skin.    [provider]  ?methylPREDNISolone (MEDROL DOSEPAK) 4 MG TBPK tablet 6 day dose pack - take as directed 03/12/20   Hyatt, Max T, DPM  ?montelukast (SINGULAIR) 10 MG tablet Take 10 mg by mouth daily. 04/08/19   [provider]  ? ? ?Family History ?Family History  ?Problem Relation Age of Onset  ? Other Father   ?     MS  ? Hyperlipidemia Mother   ? Osteoarthritis Mother   ? Breast cancer Maternal Grandmother   ? Coronary artery disease Neg Hx   ? ? ?Social History ?Social History  ? ?Tobacco Use  ? Smoking status: Never  ? Smokeless tobacco: Never  ?Substance Use Topics  ? Alcohol use: No  ? Drug use: No  ? ? ? ?Allergies   ?Ciprofloxacin and Penicillins ? ? ?Review of Systems ?Review of Systems  ?Constitutional:  Negative for chills and fever.  ?Skin:  Positive for wound.  Negative for color change.  ?Neurological:  Negative for weakness and numbness.  ?All other systems reviewed and are negative. ? ? ?Physical Exam ?Triage Vital Signs ?ED Triage Vitals [04/19/21 1942]  ?Enc Vitals Group  ?   BP   ?   Pulse   ?   Resp   ?   Temp 98.4 ?F (36.9 ?C)  ?   Temp src   ?   SpO2   ?   Weight   ?   Height   ?   Head Circumference   ?   Peak Flow   ?   Pain Score   ?   Pain Loc   ?   Pain Edu?   ?   Excl. in [place]?   ? ?No data found. ? ?Updated Vital Signs ?BP 129/74   Pulse 66   Temp 98.4 ?F (36.9 ?C)   Resp 18   SpO2 98%  ? ?Visual Acuity ?Right Eye Distance:   ?Left Eye Distance:   ?Bilateral Distance:   ? ?Right Eye Near:   ?Left Eye Near:    ?Bilateral Near:    ? ?Physical Exam ?Vitals and nursing  note reviewed.  ?Constitutional:   ?   General: She is not in acute distress. ?   Appearance: Normal appearance. She is well-developed. She is not ill-appearing.  ?Cardiovascular:  ?   Rate and Rhythm: Normal rate and regular rhythm.  ?Pulmonary:  ?   Effort: Pulmonary effort is normal. No respiratory distress.  ?   Breath sounds: Normal breath sounds.  ?Musculoskeletal:     ?   General: No deformity. Normal range of motion.  ?   Cervical back: Neck supple.  ?Skin: ?   General: Skin is warm and dry.  ?   Capillary Refill: Capillary refill takes less than 2 seconds.  ?   Findings: Lesion present.  ?   Comments: 1 cm laceration to tip of left thumb.  Bleeding controlled with direct pressure.  Sensation intact, full range of motion, strength 5/5.  ?Neurological:  ?   General: No focal deficit present.  ?   Mental Status: She is alert and oriented to person, place, and time.  ?   Sensory: No sensory deficit.  ?   Motor: No weakness.  ?Psychiatric:     ?   Mood and Affect: Mood normal.     ?   Behavior: Behavior normal.  ? ? ? ?UC Treatments / Results  ?Labs ?(all labs ordered are listed, but only abnormal results are displayed) ?Labs Reviewed - No data to display ? ?EKG ? ? ?Radiology ?No results found. ? ?Procedures ?Laceration Repair ? ?Date/Time: 04/19/2021 8:15 PM ?Performed by: Sharion Balloon, NP ?Authorized by: Sharion Balloon, NP  ? ?Consent:  ?  Consent obtained:  Verbal ?  Consent given by:  Patient ?  Risks discussed:  Infection, pain, poor cosmetic result and poor wound healing ?Universal protocol:  ?  Procedure explained and questions answered to patient or proxy's satisfaction: yes   ?Anesthesia:  ?  Anesthesia method:  Local infiltration ?  Local anesthetic:  Lidocaine 1% w/o epi ?Laceration details:  ?  Location:  Finger ?  Finger location:  L thumb ?  Length (cm):  1 ?  Depth (mm):  2 ?Pre-procedure details:  ?  Preparation:  Patient was prepped and draped in usual sterile fashion ?Exploration:  ?  Hemostasis  achieved with:  Direct pressure ?  Imaging outcome:  foreign body not noted   ?Treatment:  ?  Area cleansed with:  Povidone-iodine ?  Amount of cleaning:  Standard ?  Irrigation solution:  Sterile water ?  Irrigation method:  Syringe ?  Visualized foreign bodies/material removed: no   ?Skin repair:  ?  Repair method:  Sutures ?  Suture size:  4-0 ?  Suture material:  Nylon ?  Suture technique:  Simple interrupted ?  Number of sutures:  2 ?Approximation:  ?  Approximation:  Close ?Repair type:  ?  Repair type:  Simple ?Post-procedure details:  ?  Dressing:  Antibiotic ointment and non-adherent dressing ?  Procedure completion:  Tolerated well, no immediate complications (including critical care time) ? ?Medications Ordered in UC ?Medications  ?Tdap (BOOSTRIX) injection 0.5 mL (0.5 mLs Intramuscular Given 04/19/21 2015)  ? ? ?Initial Impression / Assessment and Plan / UC Course  ?I have reviewed the triage vital signs and the nursing notes. ? ?Pertinent labs & imaging results that were available during my care of the patient were reviewed by me and considered in my medical decision making (see chart for details). ? ?  ?Laceration of left thumb.  2 sutures.  Tetanus updated today.  Wound care instructions and signs of infection discussed.  Instructed patient to return here in 7 to 10 days for suture removal.  Instructed her to return right away if she notes signs of infection.  She agrees to plan of care. ? ?Final Clinical Impressions(s) / UC Diagnoses  ? ?Final diagnoses:  ?Laceration of left thumb without foreign body without damage to nail, initial encounter  ? ? ? ?Discharge Instructions   ? ?  ?Your stitches need to be removed in 7-10 days.   ? ?Your tetanus was updated today.   ? ?Keep your wound clean and dry.  Wash it gently twice a day with soap and water.  Apply an antibiotic cream and bandage twice a day. After a couple of days, leave it open to air.   ? ?Return here if you see signs of infection, such as  increased pain, redness, pus-like drainage, warmth, fever, chills, or other concerning symptoms.   ? ? ? ? ? ?ED Prescriptions   ?None ?  ? ?PDMP not reviewed this encounter. ?  ?Sharion Balloon, NP ?04/19/21 20

## 2021-04-22 ENCOUNTER — Ambulatory Visit (INDEPENDENT_AMBULATORY_CARE_PROVIDER_SITE_OTHER): Payer: BC Managed Care – PPO

## 2021-04-22 ENCOUNTER — Ambulatory Visit: Payer: BC Managed Care – PPO | Admitting: Podiatry

## 2021-04-22 DIAGNOSIS — M722 Plantar fascial fibromatosis: Secondary | ICD-10-CM | POA: Diagnosis not present

## 2021-04-22 DIAGNOSIS — Q666 Other congenital valgus deformities of feet: Secondary | ICD-10-CM | POA: Diagnosis not present

## 2021-04-22 MED ORDER — TRIAMCINOLONE ACETONIDE 40 MG/ML IJ SUSP
40.0000 mg | Freq: Once | INTRAMUSCULAR | Status: AC
  Administered 2021-04-22: 40 mg

## 2021-04-22 NOTE — Progress Notes (Signed)
SITUATION ?Reason for Consult: Evaluation for Bilateral Custom Foot Orthoses ?Patient / Caregiver Report: Patient is ready for foot orthotics ? ?OBJECTIVE DATA: ?Patient History / Diagnosis:  ?  ICD-10-CM   ?1. Plantar fasciitis  M72.2   ?  ? ? ?Current or Previous Devices:   Prefab only ? ?Foot Examination: ?Skin presentation:   Intact ?Ulcers & Callousing:   None ?Toe / Foot Deformities:  None ?Weight Bearing Presentation:  Rectus ?Sensation:    Intact ? ?Shoe Size:    54M ? ?ORTHOTIC RECOMMENDATION ?Recommended Device: 1x pair of custom functional foot orthotics ? ?GOALS OF ORTHOSES ?- Reduce Pain ?- Prevent Foot Deformity ?- Prevent Progression of Further Foot Deformity ?- Relieve Pressure ?- Improve the Overall Biomechanical Function of the Foot and Lower Extremity. ? ?ACTIONS PERFORMED ?Potential out of pocket cost was communicated to patient. Patient understood and consent to casting. Patient was casted for Foot Orthoses via crush box. Procedure was explained and patient tolerated procedure well. Casts were shipped to central fabrication. All questions were answered and concerns addressed. ? ?PLAN ?Patient is to be called for fitting when devices are ready.  ? ? ?

## 2021-04-22 NOTE — Progress Notes (Signed)
She presents today after having not seen her for over a year with a chief complaint of a flareup in January 2023 of her Planter fasciitis bilaterally.  She states that she had to pick the left one seems to be bothering her more than anything else. ? ?Objective: Vital signs are stable alert and oriented x3.  Pulses are palpable.  There is no erythema edema cellulitis drainage or odor though she does have tenderness on palpation medial calcaneal tubercles bilateral.  Flexible pes planus is also noted bilateral. ? ?Assessment: Excessive pronation bilateral Planter fasciitis bilateral ? ?Plan: Injected the bilateral heels today 20 mg Kenalog 5 mg Marcaine point maximal tenderness.  She saw Aaron Edelman today and was casted for orthotics. ?

## 2021-06-03 ENCOUNTER — Ambulatory Visit: Payer: BC Managed Care – PPO

## 2021-06-03 NOTE — Progress Notes (Signed)
SITUATION: Reason for Visit: Fitting and Delivery of Custom Fabricated Foot Orthoses Patient Report: Patient reports comfort and is satisfied with device.  OBJECTIVE DATA: Patient History / Diagnosis:   No diagnosis found.  Provided Device:  Custom Functional Foot Orthotics     [facility]: [id number]  GOAL OF ORTHOSIS - Improve gait - Decrease energy expenditure - Improve Balance - Provide Triplanar stability of foot complex - Facilitate motion  ACTIONS PERFORMED Patient was fit with foot orthotics trimmed to shoe last. Patient tolerated fittign procedure.   Patient was provided with verbal and written instruction and demonstration regarding donning, doffing, wear, care, proper fit, function, purpose, cleaning, and use of the orthosis and in all related precautions and risks and benefits regarding the orthosis.  Patient was also provided with verbal instruction regarding how to report any failures or malfunctions of the orthosis and necessary follow up care. Patient was also instructed to contact our office regarding any change in status that may affect the function of the orthosis.  Patient demonstrated independence with proper donning, doffing, and fit and verbalized understanding of all instructions.  PLAN: Patient is to follow up in one week or as necessary (PRN). All questions were answered and concerns addressed. Plan of care was discussed with and agreed upon by the patient.

## 2021-06-28 ENCOUNTER — Other Ambulatory Visit: Payer: Self-pay | Admitting: Podiatry

## 2022-03-17 ENCOUNTER — Other Ambulatory Visit: Payer: BC Managed Care – PPO

## 2022-03-17 ENCOUNTER — Other Ambulatory Visit: Payer: Self-pay | Admitting: Internal Medicine

## 2022-03-17 ENCOUNTER — Ambulatory Visit
Admission: RE | Admit: 2022-03-17 | Discharge: 2022-03-17 | Disposition: A | Discharge status: Home / Self Care | Payer: BC Managed Care – PPO | Source: Ambulatory Visit | Attending: Internal Medicine | Admitting: Internal Medicine

## 2022-03-17 DIAGNOSIS — R1031 Right lower quadrant pain: Secondary | ICD-10-CM

## 2022-03-17 MED ORDER — IOPAMIDOL (ISOVUE-300) INJECTION 61%
100.0000 mL | Freq: Once | INTRAVENOUS | Status: AC | PRN
  Administered 2022-03-17: 100 mL via INTRAVENOUS

## 2022-05-14 ENCOUNTER — Ambulatory Visit: Payer: BC Managed Care – PPO | Admitting: Podiatry

## 2022-05-14 ENCOUNTER — Encounter: Payer: Self-pay | Admitting: Podiatry

## 2022-05-14 DIAGNOSIS — M722 Plantar fascial fibromatosis: Secondary | ICD-10-CM

## 2022-05-14 MED ORDER — TRIAMCINOLONE ACETONIDE 40 MG/ML IJ SUSP
40.0000 mg | Freq: Once | INTRAMUSCULAR | Status: AC
  Administered 2022-05-14: 40 mg

## 2022-05-14 NOTE — Progress Notes (Signed)
She presents today for follow-up of her Planter fasciitis bilateral she states that every year I have to get a shot.  Objective: Vital signs stable alert oriented x 3 pulses are palpable.  She has pain on palpation medial calcaneal tubercle.  Assessment: Planter fasciitis bilateral.  Plan: Reinjected bilateral heels today 20 mg Kenalog 5 mg Marcaine point maximal tenderness.  She will be rescanned for new set of orthotics at the [place] office.

## 2022-06-06 ENCOUNTER — Other Ambulatory Visit: Payer: BC Managed Care – PPO

## 2022-12-31 ENCOUNTER — Ambulatory Visit: Payer: BC Managed Care – PPO | Admitting: Podiatry

## 2022-12-31 ENCOUNTER — Encounter: Payer: Self-pay | Admitting: Podiatry

## 2022-12-31 DIAGNOSIS — M722 Plantar fascial fibromatosis: Secondary | ICD-10-CM | POA: Diagnosis not present

## 2022-12-31 DIAGNOSIS — M62469 Contracture of muscle, unspecified lower leg: Secondary | ICD-10-CM

## 2022-12-31 MED ORDER — METHYLPREDNISOLONE 4 MG PO TBPK: ORAL_TABLET | ORAL | 0 refills | Status: AC

## 2022-12-31 MED ORDER — MELOXICAM 15 MG PO TABS: 15.0000 mg | ORAL_TABLET | Freq: Every day | ORAL | 0 refills | Status: AC

## 2022-12-31 NOTE — Progress Notes (Unsigned)
Subjective:  Patient ID: [name], female    DOB: October 12, 1975,  MRN: [id number]  Chief Complaint  Patient presents with   Foot Pain    She has a lot of heel pain with walking. She is not sure if its more pain with her low arch or the heel    47 y.o. female presents with the above complaint.  Patient presents with bilateral plantar fasciitis left greater than right side.  Patient states that she has a lot of pain while walking.  She is known to Dr. Al Corpus.  She states that she has really low arch.  She wanted to discuss treatment options for it.  She has been wearing her orthotics.  It just keeps coming back and she has not had full relief pain scale 7 out of 10 dull aching nature.   Review of Systems: Negative except as noted in the HPI. Denies N/V/F/Ch.  Past Medical History:  Diagnosis Date   Hyperlipidemia    PE (physical exam), annual    S/P sinus surgery     Current Outpatient Medications:    CLARITIN 10 MG tablet, , Disp: , Rfl:    D 1000 25 MCG (1000 UT) capsule, SMARTSIG:1 By Mouth, Disp: , Rfl:    etodolac (LODINE XL) 500 MG 24 hr tablet, TAKE 1 TABLET(500 MG) BY MOUTH DAILY, Disp: 90 tablet, Rfl: 2   fluticasone (FLONASE) 50 MCG/ACT nasal spray, Place into both nostrils daily., Disp: , Rfl:    linaCLOtide (LINZESS PO), Take by mouth., Disp: , Rfl:    Liraglutide -Weight Management (SAXENDA) 18 MG/3ML SOPN, Inject into the skin., Disp: , Rfl:    meloxicam (MOBIC) 15 MG tablet, Take 1 tablet (15 mg total) by mouth daily., Disp: 30 tablet, Rfl: 0   methylPREDNISolone (MEDROL DOSEPAK) 4 MG TBPK tablet, Take as directed, Disp: 21 each, Rfl: 0   montelukast (SINGULAIR) 10 MG tablet, Take 10 mg by mouth daily., Disp: , Rfl:    OSTEO BI-FLEX REGULAR STRENGTH 250-200 MG TABS, , Disp: , Rfl:    SYNTHROID 100 MCG tablet, SMARTSIG:1 By Mouth, Disp: , Rfl:   Social History   Tobacco Use  Smoking Status Never  Smokeless Tobacco Never    Allergies  Allergen Reactions    Ciprofloxacin Anaphylaxis    Other reaction(s): Cough, Cough (ALLERGY/intolerance)   Objective:  There were no vitals filed for this visit. There is no height or weight on file to calculate BMI. Constitutional Well developed. Well nourished.  Vascular Dorsalis pedis pulses palpable bilaterally. Posterior tibial pulses palpable bilaterally. Capillary refill normal to all digits.  No cyanosis or clubbing noted. Pedal hair growth normal.  Neurologic Normal speech. Oriented to person, place, and time. Epicritic sensation to light touch grossly present bilaterally.  Dermatologic Nails well groomed and normal in appearance. No open wounds. No skin lesions.  Orthopedic: Normal joint ROM without pain or crepitus bilaterally. No visible deformities. Tender to palpation at the calcaneal tuber bilaterally. No pain with calcaneal squeeze bilaterally. Ankle ROM diminished range of motion bilaterally. Silfverskiold Test: positive bilaterally.   Radiographs: None  Assessment:   1. Plantar fasciitis of right foot   2. Plantar fasciitis of left foot   3. Gastrocnemius equinus, unspecified laterality    Plan:  Patient was evaluated and treated and all questions answered.  Plantar Fasciitis, bilaterally with underlying gastrocnemius equinus - XR reviewed as above.  - Educated on icing and stretching. Instructions given.  - Injection delivered to the plantar fascia  as below. - DME: I will also place her on cam boot due to how aggravated the plantar fascia is.  If there is no improvement we will need to discuss PRP versus surgical intervention she already has cam boot at home - Pharmacologic management: None  Procedure: Injection Tendon/Ligament Location: Bilateral plantar fascia at the glabrous junction; medial approach. Skin Prep: alcohol Injectate: 0.5 cc 0.5% marcaine plain, 0.5 cc of 1% Lidocaine, 0.5 cc kenalog 10. Disposition: Patient tolerated procedure well. Injection site dressed  with a band-aid.  No follow-ups on file.

## 2023-01-29 ENCOUNTER — Ambulatory Visit: Payer: 59 | Admitting: Podiatry

## 2023-01-29 ENCOUNTER — Encounter: Payer: Self-pay | Admitting: Podiatry

## 2023-01-29 ENCOUNTER — Ambulatory Visit: Payer: BC Managed Care – PPO | Admitting: Podiatry

## 2023-01-29 VITALS — Ht 69.0 in | Wt 172.5 lb

## 2023-01-29 DIAGNOSIS — M62469 Contracture of muscle, unspecified lower leg: Secondary | ICD-10-CM | POA: Diagnosis not present

## 2023-01-29 DIAGNOSIS — M722 Plantar fascial fibromatosis: Secondary | ICD-10-CM | POA: Diagnosis not present

## 2023-01-29 NOTE — Progress Notes (Signed)
  Subjective:  Patient ID: [name], female    DOB: [birth year],  MRN: [id number]  Chief Complaint  Patient presents with   Foot Pain    Pt is here to f/u on bilateral heel pain states the pain has still been there since her last visit a burning sensation in both heels the left being the worst.    48 y.o. female presents with the above complaint.  Patient [name] for follow-up of bilateral plantar fasciitis left greater than right side.  She states the injection helped a little bit but she still has pain.  She would like to discuss next treatment plan.  The boot helped a little bit but did not get rid of her pain.  Review of Systems: Negative except as noted in the HPI. Denies N/V/F/Ch.  Past Medical History:  Diagnosis Date   Hyperlipidemia    PE (physical exam), annual    S/P sinus surgery     Current Outpatient Medications:    D 1000 25 MCG (1000 UT) capsule, SMARTSIG:1 By Mouth, Disp: , Rfl:    etodolac (LODINE XL) 500 MG 24 hr tablet, TAKE 1 TABLET(500 MG) BY MOUTH DAILY, Disp: 90 tablet, Rfl: 2   fluticasone (FLONASE) 50 MCG/ACT nasal spray, Place into both nostrils daily., Disp: , Rfl:    linaCLOtide (LINZESS PO), Take by mouth., Disp: , Rfl:    meloxicam (MOBIC) 15 MG tablet, Take 1 tablet (15 mg total) by mouth daily., Disp: 30 tablet, Rfl: 0   methylPREDNISolone (MEDROL DOSEPAK) 4 MG TBPK tablet, Take as directed, Disp: 21 each, Rfl: 0   montelukast (SINGULAIR) 10 MG tablet, Take 10 mg by mouth daily., Disp: , Rfl:    SYNTHROID 100 MCG tablet, SMARTSIG:1 By Mouth, Disp: , Rfl:   Social History   Tobacco Use  Smoking Status Never  Smokeless Tobacco Never    Allergies  Allergen Reactions   Ciprofloxacin Anaphylaxis    Other reaction(s): Cough, Cough (ALLERGY/intolerance)   Objective:  There were no vitals filed for this visit. Body mass index is 25.47 kg/m. Constitutional Well developed. Well nourished.  Vascular Dorsalis pedis pulses palpable  bilaterally. Posterior tibial pulses palpable bilaterally. Capillary refill normal to all digits.  No cyanosis or clubbing noted. Pedal hair growth normal.  Neurologic Normal speech. Oriented to person, place, and time. Epicritic sensation to light touch grossly present bilaterally.  Dermatologic Nails well groomed and normal in appearance. No open wounds. No skin lesions.  Orthopedic: Normal joint ROM without pain or crepitus bilaterally. No visible deformities. Tender to palpation at the calcaneal tuber bilaterally. No pain with calcaneal squeeze bilaterally. Ankle ROM diminished range of motion bilaterally. Silfverskiold Test: positive bilaterally.   Radiographs: None  Assessment:   No diagnosis found.  Plan:  Patient was evaluated and treated and all questions answered.  Plantar Fasciitis, bilaterally with underlying gastrocnemius equinus - XR reviewed as above.  - Educated on icing and stretching. Instructions given.  - second Injection delivered to the plantar fascia as below. - DME we will plan on doing PRP injection during next visit if the second injection does not work - Pharmacologic management: None  Procedure: Injection Tendon/Ligament Location: Bilateral plantar fascia at the glabrous junction; medial approach. Skin Prep: alcohol Injectate: 0.5 cc 0.5% marcaine plain, 0.5 cc of 1% Lidocaine, 0.5 cc kenalog 10. Disposition: Patient tolerated procedure well. Injection site dressed with a band-aid.  No follow-ups on file.

## 2023-02-25 ENCOUNTER — Ambulatory Visit: Payer: 59 | Admitting: Podiatry

## 2023-02-25 DIAGNOSIS — M722 Plantar fascial fibromatosis: Secondary | ICD-10-CM

## 2023-02-25 NOTE — Progress Notes (Signed)
  Subjective:  Patient ID: [name], female    DOB: Apr 20, 1975,  MRN: [id number]  Chief Complaint  Patient presents with   Plantar Fasciitis    Pt stated that her pain has got a little better     48 y.o. female presents with the above complaint.  Patient presents for follow-up of bilateral plantar fasciitis.  She states she is doing better.  The injection helped.  She still is about a 90% better.  Not quite 100%.  Review of Systems: Negative except as noted in the HPI. Denies N/V/F/Ch.  Past Medical History:  Diagnosis Date   Hyperlipidemia    PE (physical exam), annual    S/P sinus surgery     Current Outpatient Medications:    D 1000 25 MCG (1000 UT) capsule, SMARTSIG:1 By Mouth, Disp: , Rfl:    etodolac (LODINE XL) 500 MG 24 hr tablet, TAKE 1 TABLET(500 MG) BY MOUTH DAILY, Disp: 90 tablet, Rfl: 2   fluticasone (FLONASE) 50 MCG/ACT nasal spray, Place into both nostrils daily., Disp: , Rfl:    linaCLOtide (LINZESS PO), Take by mouth., Disp: , Rfl:    meloxicam (MOBIC) 15 MG tablet, Take 1 tablet (15 mg total) by mouth daily., Disp: 30 tablet, Rfl: 0   methylPREDNISolone (MEDROL DOSEPAK) 4 MG TBPK tablet, Take as directed, Disp: 21 each, Rfl: 0   montelukast (SINGULAIR) 10 MG tablet, Take 10 mg by mouth daily., Disp: , Rfl:    SYNTHROID 100 MCG tablet, SMARTSIG:1 By Mouth, Disp: , Rfl:   Social History   Tobacco Use  Smoking Status Never  Smokeless Tobacco Never    Allergies  Allergen Reactions   Ciprofloxacin Anaphylaxis    Other reaction(s): Cough, Cough (ALLERGY/intolerance)   Objective:  There were no vitals filed for this visit. There is no height or weight on file to calculate BMI. Constitutional Well developed. Well nourished.  Vascular Dorsalis pedis pulses palpable bilaterally. Posterior tibial pulses palpable bilaterally. Capillary refill normal to all digits.  No cyanosis or clubbing noted. Pedal hair growth normal.  Neurologic Normal  speech. Oriented to person, place, and time. Epicritic sensation to light touch grossly present bilaterally.  Dermatologic Nails well groomed and normal in appearance. No open wounds. No skin lesions.  Orthopedic: Normal joint ROM without pain or crepitus bilaterally. No visible deformities. Very mild tender to palpation at the calcaneal tuber bilaterally. No pain with calcaneal squeeze bilaterally. Ankle ROM diminished range of motion bilaterally. Silfverskiold Test: positive bilaterally.   Radiographs: None  Assessment:   No diagnosis found.  Plan:  Patient was evaluated and treated and all questions answered.  Plantar Fasciitis, bilaterally with underlying gastrocnemius equinus -Clinically healed and doing much better.  If there is no improvement we will discuss PRP injection.  She is 90 to 95% better.  We will continue clinically monitor if it regresses she will come and see me.  No follow-ups on file.

## 2023-06-16 ENCOUNTER — Ambulatory Visit: Payer: Self-pay | Admitting: Podiatry

## 2023-06-16 DIAGNOSIS — M62469 Contracture of muscle, unspecified lower leg: Secondary | ICD-10-CM

## 2023-06-16 DIAGNOSIS — M722 Plantar fascial fibromatosis: Secondary | ICD-10-CM | POA: Diagnosis not present

## 2023-06-16 NOTE — Progress Notes (Signed)
  Subjective:  Patient ID: [name], female    DOB: [birth year],  MRN: [id number]  Chief Complaint  Patient presents with   Injections    Pt stated that her feet are better than what it was but she is going out of town next week     48 y.o. female presents with the above complaint.  Patient presents with follow-up of bilateral plantar fasciitis.  She states that she was doing better however her pain started coming back and is hurting.  She is going out of town next week and wanted to get a shot of injection.  If there is no improvement we will discuss PRP  Review of Systems: Negative except as noted in the HPI. Denies N/V/F/Ch.  Past Medical History:  Diagnosis Date   Hyperlipidemia    PE (physical exam), annual    S/P sinus surgery     Current Outpatient Medications:    D 1000 25 MCG (1000 UT) capsule, SMARTSIG:1 By Mouth, Disp: , Rfl:    etodolac  (LODINE  XL) 500 MG 24 hr tablet, TAKE 1 TABLET(500 MG) BY MOUTH DAILY, Disp: 90 tablet, Rfl: 2   fluticasone (FLONASE) 50 MCG/ACT nasal spray, Place into both nostrils daily., Disp: , Rfl:    linaCLOtide (LINZESS PO), Take by mouth., Disp: , Rfl:    meloxicam  (MOBIC ) 15 MG tablet, Take 1 tablet (15 mg total) by mouth daily., Disp: 30 tablet, Rfl: 0   methylPREDNISolone  (MEDROL  DOSEPAK) 4 MG TBPK tablet, Take as directed, Disp: 21 each, Rfl: 0   montelukast (SINGULAIR) 10 MG tablet, Take 10 mg by mouth daily., Disp: , Rfl:    SYNTHROID 100 MCG tablet, SMARTSIG:1 By Mouth, Disp: , Rfl:   Social History   Tobacco Use  Smoking Status Never  Smokeless Tobacco Never    Allergies  Allergen Reactions   Ciprofloxacin Anaphylaxis    Other reaction(s): Cough, Cough (ALLERGY/intolerance)   Objective:  There were no vitals filed for this visit. There is no height or weight on file to calculate BMI. Constitutional Well developed. Well nourished.  Vascular Dorsalis pedis pulses palpable bilaterally. Posterior tibial pulses palpable  bilaterally. Capillary refill normal to all digits.  No cyanosis or clubbing noted. Pedal hair growth normal.  Neurologic Normal speech. Oriented to person, place, and time. Epicritic sensation to light touch grossly present bilaterally.  Dermatologic Nails well groomed and normal in appearance. No open wounds. No skin lesions.  Orthopedic: Normal joint ROM without pain or crepitus bilaterally. No visible deformities. Tender to palpation at the calcaneal tuber bilaterally. No pain with calcaneal squeeze bilaterally. Ankle ROM diminished range of motion bilaterally. Silfverskiold Test: positive bilaterally.   Radiographs: None  Assessment:   No diagnosis found.  Plan:  Patient was evaluated and treated and all questions answered.  Plantar Fasciitis, bilaterally with underlying gastrocnemius equinus - XR reviewed as above.  - Educated on icing and stretching. Instructions given.  - second Injection delivered to the plantar fascia as below. - DME we will plan on doing PRP injection during next visit if the second injection does not work - Pharmacologic management: None  Procedure: Injection Tendon/Ligament Location: Bilateral plantar fascia at the glabrous junction; medial approach. Skin Prep: alcohol Injectate: 0.5 cc 0.5% marcaine plain, 0.5 cc of 1% Lidocaine, 0.5 cc kenalog  10. Disposition: Patient tolerated procedure well. Injection site dressed with a band-aid.  No follow-ups on file.

## 2023-07-21 ENCOUNTER — Ambulatory Visit: Admitting: Podiatry

## 2023-07-21 ENCOUNTER — Encounter: Payer: Self-pay | Admitting: Podiatry

## 2023-07-21 VITALS — Ht 69.0 in | Wt 172.5 lb

## 2023-07-21 DIAGNOSIS — M62469 Contracture of muscle, unspecified lower leg: Secondary | ICD-10-CM

## 2023-07-21 DIAGNOSIS — M722 Plantar fascial fibromatosis: Secondary | ICD-10-CM

## 2023-07-21 NOTE — Progress Notes (Signed)
 Subjective:  Patient ID: [name], female    DOB: 1975-08-11,  MRN: [id number]  Chief Complaint  Patient presents with   Foot Pain    Pt is here to f/u on right foot due pain states she received an injection to foot on last visit and it was doing good until the last few days the pain has came back.    48 y.o. female presents with the above complaint.  Patient presents with follow-up of bilateral plantar fasciitis.  She states that she was doing better however her pain started coming back and is hurting.  She is going out of town next week and wanted to get a shot of injection.  If there is no improvement we will discuss PRP  Review of Systems: Negative except as noted in the HPI. Denies N/V/F/Ch.  Past Medical History:  Diagnosis Date   Hyperlipidemia    PE (physical exam), annual    S/P sinus surgery     Current Outpatient Medications:    D 1000 25 MCG (1000 UT) capsule, SMARTSIG:1 By Mouth, Disp: , Rfl:    etodolac  (LODINE  XL) 500 MG 24 hr tablet, TAKE 1 TABLET(500 MG) BY MOUTH DAILY, Disp: 90 tablet, Rfl: 2   fluticasone (FLONASE) 50 MCG/ACT nasal spray, Place into both nostrils daily., Disp: , Rfl:    linaCLOtide (LINZESS PO), Take by mouth., Disp: , Rfl:    meloxicam  (MOBIC ) 15 MG tablet, Take 1 tablet (15 mg total) by mouth daily., Disp: 30 tablet, Rfl: 0   methylPREDNISolone  (MEDROL  DOSEPAK) 4 MG TBPK tablet, Take as directed, Disp: 21 each, Rfl: 0   montelukast (SINGULAIR) 10 MG tablet, Take 10 mg by mouth daily., Disp: , Rfl:    SYNTHROID 100 MCG tablet, SMARTSIG:1 By Mouth, Disp: , Rfl:   Social History   Tobacco Use  Smoking Status Never  Smokeless Tobacco Never    Allergies  Allergen Reactions   Ciprofloxacin Anaphylaxis    Other reaction(s): Cough, Cough (ALLERGY/intolerance)   Objective:  There were no vitals filed for this visit. Body mass index is 25.47 kg/m. Constitutional Well developed. Well nourished.  Vascular Dorsalis pedis pulses  palpable bilaterally. Posterior tibial pulses palpable bilaterally. Capillary refill normal to all digits.  No cyanosis or clubbing noted. Pedal hair growth normal.  Neurologic Normal speech. Oriented to person, place, and time. Epicritic sensation to light touch grossly present bilaterally.  Dermatologic Nails well groomed and normal in appearance. No open wounds. No skin lesions.  Orthopedic: Normal joint ROM without pain or crepitus bilaterally. No visible deformities. Tender to palpation at the calcaneal tuber bilaterally. No pain with calcaneal squeeze bilaterally. Ankle ROM diminished range of motion bilaterally. Silfverskiold Test: positive bilaterally.   Radiographs: None  Assessment:   No diagnosis found.  Plan:  Patient was evaluated and treated and all questions answered.  Plantar Fasciitis, bilaterally with underlying gastrocnemius equinus - XR reviewed as above.  - Educated on icing and stretching. Instructions given.  - second Injection delivered to the plantar fascia as below. - DME we will plan on doing PRP injection during next visit she continues to fail injections - Pharmacologic management: None - Patient will try physical therapy with Sam.  Referral was sent  Procedure: Injection Tendon/Ligament Location: Bilateral plantar fascia at the glabrous junction; medial approach. Skin Prep: alcohol Injectate: 0.5 cc 0.5% marcaine plain, 0.5 cc of 1% Lidocaine, 0.5 cc kenalog  10. Disposition: Patient tolerated procedure well. Injection site dressed with a band-aid.  No follow-ups  on file.    Bilateral plantar fasciitis injection  Retry physical therapy with Sam

## 2023-10-29 ENCOUNTER — Ambulatory Visit: Attending: Registered Nurse

## 2023-10-29 ENCOUNTER — Other Ambulatory Visit: Payer: Self-pay | Admitting: *Deleted

## 2023-10-29 DIAGNOSIS — R002 Palpitations: Secondary | ICD-10-CM

## 2023-10-29 NOTE — Progress Notes (Unsigned)
 Enrolled for [employer] to mail a ZIO XT long term holter monitor to the patients address on file.   EP to read
